# Patient Record
Sex: Female | Born: 1995 | Race: Black or African American | Hispanic: No | Marital: Single | Smoking: Never smoker
Health system: Southern US, Community
[De-identification: ages and names within clinical notes are randomized; demographics above are authoritative.]

## PROBLEM LIST (undated history)

## (undated) ENCOUNTER — Inpatient Hospital Stay (HOSPITAL_COMMUNITY): Payer: Self-pay

## (undated) DIAGNOSIS — B999 Unspecified infectious disease: Secondary | ICD-10-CM

## (undated) HISTORY — PX: NO PAST SURGERIES: SHX2092

---
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
## 2013-05-12 NOTE — L&D Delivery Note (Signed)
Delivery Note At 3:37 PM a viable female was delivered via Vaginal, Spontaneous Delivery (Presentation: Left Occiput Anterior).  APGAR: 8, ; weight .   Placenta status: Intact, Spontaneous.  Cord: 3 vessels with the following complications: .  Cord pH: not done  Anesthesia: Epidural  Episiotomy: Median Lacerations:  Suture Repair: 2.0 vicryl Est. Blood Loss (mL):   Mom to postpartum.  Baby to Couplet care / Skin to Skin.  MARSHALL,BERNARD A 01/25/2014, 3:52 PM

## 2013-06-07 ENCOUNTER — Emergency Department (INDEPENDENT_AMBULATORY_CARE_PROVIDER_SITE_OTHER)
Admission: EM | Admit: 2013-06-07 | Discharge: 2013-06-07 | Disposition: A | Discharge status: Home / Self Care | Payer: Medicaid Other | Source: Home / Self Care | Attending: Family Medicine | Admitting: Family Medicine

## 2013-06-07 ENCOUNTER — Encounter (HOSPITAL_COMMUNITY): Payer: Self-pay | Admitting: Emergency Medicine

## 2013-06-07 DIAGNOSIS — K529 Noninfective gastroenteritis and colitis, unspecified: Secondary | ICD-10-CM

## 2013-06-07 DIAGNOSIS — K5289 Other specified noninfective gastroenteritis and colitis: Secondary | ICD-10-CM

## 2013-06-07 MED ORDER — ONDANSETRON HCL 8 MG PO TABS: 8.0000 mg | ORAL_TABLET | Freq: Three times a day (TID) | ORAL | Status: DC | PRN

## 2013-06-07 MED ORDER — ONDANSETRON 4 MG PO TBDP
8.0000 mg | ORAL_TABLET | Freq: Once | ORAL | Status: AC
  Administered 2013-06-07: 8 mg via ORAL

## 2013-06-07 NOTE — Discharge Instructions (Signed)
Thank you for coming in today. Use Zofran every 8 hours as needed for vomiting starting 8 hours from now.  Continue frequent fluids as tolerated.  If your belly pain worsens, or you have high fever, bad vomiting, blood in your stool or black tarry stool go to the Emergency Room.

## 2013-06-07 NOTE — ED Provider Notes (Signed)
Mariah Leonard is a 18 y.o. female who presents to Urgent Care today for nausea vomiting and diarrhea occurring since last night. Symptoms started after eating a pizza restaurant. Her friend was also sick. She has had multiple episodes of vomiting and diarrhea. She denies any abdominal pain or blood in her vomit or stool. She feels well otherwise no fevers or chills.   History reviewed. No pertinent past medical history. History  Substance Use Topics  . Smoking status: Never Smoker   . Smokeless tobacco: Not on file  . Alcohol Use: No   ROS as above Medications: No current facility-administered medications for this encounter.   Current Outpatient Prescriptions  Medication Sig Dispense Refill  . ondansetron (ZOFRAN) 8 MG tablet Take 1 tablet (8 mg total) by mouth every 8 (eight) hours as needed for nausea or vomiting.  20 tablet  0    Exam:  BP 114/69  Pulse 77  Temp(Src) 98.3 F (36.8 C) (Oral)  Resp 18  SpO2 100%  LMP 04/30/2013 Gen: Well NAD HEENT: EOMI,  MMM Lungs: Normal work of breathing. CTABL Heart: RRR no MRG Abd: NABS, Soft. NT, ND Exts: Brisk capillary refill, warm and well perfused.    Assessment and Plan: 18 y.o. female with viral gastroenteritis. Plan to treat with Zofran oral fluids and watchful waiting  Discussed warning signs or symptoms. Please see discharge instructions. Patient expresses understanding.    Rodolph BongEvan S Jadd Gasior, MD 06/07/13 [phone]

## 2013-06-07 NOTE — ED Notes (Signed)
Pt c/o having vomiting and diarrhea episodes since eating out last night Reports 6 vomiting and diarrhea episodes today Denies fevers, colds sxs, urinary sxs Alert w/no signs of acute distress.

## 2013-06-09 ENCOUNTER — Emergency Department (HOSPITAL_COMMUNITY)
Admission: EM | Admit: 2013-06-09 | Discharge: 2013-06-09 | Disposition: A | Discharge status: Home / Self Care | Payer: Medicaid Other | Attending: Emergency Medicine | Admitting: Emergency Medicine

## 2013-06-09 ENCOUNTER — Encounter (HOSPITAL_COMMUNITY): Payer: Self-pay | Admitting: Emergency Medicine

## 2013-06-09 DIAGNOSIS — Z3490 Encounter for supervision of normal pregnancy, unspecified, unspecified trimester: Secondary | ICD-10-CM

## 2013-06-09 DIAGNOSIS — O210 Mild hyperemesis gravidarum: Secondary | ICD-10-CM | POA: Insufficient documentation

## 2013-06-09 DIAGNOSIS — R109 Unspecified abdominal pain: Secondary | ICD-10-CM | POA: Insufficient documentation

## 2013-06-09 DIAGNOSIS — O9989 Other specified diseases and conditions complicating pregnancy, childbirth and the puerperium: Secondary | ICD-10-CM | POA: Insufficient documentation

## 2013-06-09 LAB — URINALYSIS, ROUTINE W REFLEX MICROSCOPIC
Glucose, UA: NEGATIVE mg/dL
Ketones, ur: 15 mg/dL — AB
Nitrite: NEGATIVE
PROTEIN: 30 mg/dL — AB
SPECIFIC GRAVITY, URINE: 1.03 (ref 1.005–1.030)
UROBILINOGEN UA: 1 mg/dL (ref 0.0–1.0)
pH: 6.5 (ref 5.0–8.0)

## 2013-06-09 LAB — COMPREHENSIVE METABOLIC PANEL
ALT: 12 U/L (ref 0–35)
AST: 17 U/L (ref 0–37)
Albumin: 4 g/dL (ref 3.5–5.2)
Alkaline Phosphatase: 53 U/L (ref 39–117)
BILIRUBIN TOTAL: 0.3 mg/dL (ref 0.3–1.2)
BUN: 9 mg/dL (ref 6–23)
CALCIUM: 9.1 mg/dL (ref 8.4–10.5)
CHLORIDE: 103 meq/L (ref 96–112)
CO2: 23 mEq/L (ref 19–32)
Creatinine, Ser: 0.72 mg/dL (ref 0.50–1.10)
GFR calc Af Amer: >90 mL/min (ref >90)
Glucose, Bld: 99 mg/dL (ref 70–99)
Potassium: 3.9 mEq/L (ref 3.7–5.3)
Sodium: 138 mEq/L (ref 137–147)
Total Protein: 7.7 g/dL (ref 6.0–8.3)

## 2013-06-09 LAB — CBC WITH DIFFERENTIAL/PLATELET
BASOS PCT: 0 % (ref 0–1)
Basophils Absolute: 0 10*3/uL (ref 0.0–0.1)
EOS ABS: 0.1 10*3/uL (ref 0.0–0.7)
EOS PCT: 1 % (ref 0–5)
HEMATOCRIT: 35.7 % — AB (ref 36.0–46.0)
Hemoglobin: 12.2 g/dL (ref 12.0–15.0)
Lymphocytes Relative: 25 % (ref 12–46)
Lymphs Abs: 1.7 10*3/uL (ref 0.7–4.0)
MCH: 27.9 pg (ref 26.0–34.0)
MCHC: 34.2 g/dL (ref 30.0–36.0)
MCV: 81.7 fL (ref 78.0–100.0)
MONO ABS: 0.7 10*3/uL (ref 0.1–1.0)
MONOS PCT: 10 % (ref 3–12)
NEUTROS ABS: 4.6 10*3/uL (ref 1.7–7.7)
Neutrophils Relative %: 65 % (ref 43–77)
Platelets: 233 10*3/uL (ref 150–400)
RBC: 4.37 MIL/uL (ref 3.87–5.11)
RDW: 12.6 % (ref 11.5–15.5)
WBC: 7.1 10*3/uL (ref 4.0–10.5)

## 2013-06-09 LAB — URINE MICROSCOPIC-ADD ON

## 2013-06-09 LAB — POCT PREGNANCY, URINE: PREG TEST UR: POSITIVE — AB

## 2013-06-09 MED ORDER — SODIUM CHLORIDE 0.9 % IV BOLUS (SEPSIS)
1000.0000 mL | Freq: Once | INTRAVENOUS | Status: AC
  Administered 2013-06-09: 1000 mL via INTRAVENOUS

## 2013-06-09 MED ORDER — ONDANSETRON HCL 4 MG/2ML IJ SOLN: 4.0000 mg | Freq: Once | INTRAMUSCULAR | Status: DC

## 2013-06-09 MED ORDER — PANTOPRAZOLE SODIUM 40 MG IV SOLR
40.0000 mg | Freq: Once | INTRAVENOUS | Status: AC
  Administered 2013-06-09: 40 mg via INTRAVENOUS
  Filled 2013-06-09: qty 40

## 2013-06-09 MED ORDER — METRONIDAZOLE 500 MG PO TABS
2000.0000 mg | ORAL_TABLET | Freq: Once | ORAL | Status: AC
  Administered 2013-06-09: 2000 mg via ORAL
  Filled 2013-06-09: qty 4

## 2013-06-09 MED ORDER — ONDANSETRON HCL 4 MG/2ML IJ SOLN
4.0000 mg | Freq: Once | INTRAMUSCULAR | Status: AC
  Administered 2013-06-09: 4 mg via INTRAVENOUS
  Filled 2013-06-09: qty 2

## 2013-06-09 MED ORDER — PROMETHAZINE HCL 25 MG PO TABS: 25.0000 mg | ORAL_TABLET | Freq: Four times a day (QID) | ORAL | Status: DC | PRN

## 2013-06-09 NOTE — Discharge Instructions (Signed)
Follow up for your pregnancy

## 2013-06-09 NOTE — ED Notes (Addendum)
Pt reports having abd and n/v/d for several days. Went to ucc on 1/27 and told it was food poisoning, was given nausea meds but no relief of symptoms.

## 2013-06-09 NOTE — ED Provider Notes (Signed)
CSN: [id number]     Arrival date & time 06/09/13  1152 History   First MD Initiated Contact with Patient 06/09/13 1251     Chief Complaint  Patient presents with  . Emesis  . Abdominal Pain   (Consider location/radiation/quality/duration/timing/severity/associated sxs/prior Treatment) Patient is a 18 y.o. female presenting with vomiting and abdominal pain. The history is provided by the patient (the pt complains of vomiting).  Emesis Severity:  Moderate Timing:  Intermittent Quality:  Unable to specify Able to tolerate:  Liquids Progression:  Unchanged Chronicity:  Recurrent Recent urination:  Normal Associated symptoms: abdominal pain   Associated symptoms: no diarrhea and no headaches   Abdominal Pain Associated symptoms: vomiting   Associated symptoms: no chest pain, no cough, no diarrhea, no fatigue and no hematuria     History reviewed. No pertinent past medical history. History reviewed. No pertinent past surgical history. History reviewed. No pertinent family history. History  Substance Use Topics  . Smoking status: Never Smoker   . Smokeless tobacco: Not on file  . Alcohol Use: No   OB History   Grav Para Term Preterm Abortions TAB SAB Ect Mult Living                 Review of Systems  Constitutional: Negative for appetite change and fatigue.  HENT: Negative for congestion, ear discharge and sinus pressure.   Eyes: Negative for discharge.  Respiratory: Negative for cough.   Cardiovascular: Negative for chest pain.  Gastrointestinal: Positive for vomiting and abdominal pain. Negative for diarrhea.  Genitourinary: Negative for frequency and hematuria.  Musculoskeletal: Negative for back pain.  Skin: Negative for rash.  Neurological: Negative for seizures and headaches.  Psychiatric/Behavioral: Negative for hallucinations.    Allergies  Review of patient's allergies indicates no known allergies.  Home Medications   Current Outpatient Rx  Name  Route   Sig  Dispense  Refill  . ondansetron (ZOFRAN) 8 MG tablet   Oral   Take 1 tablet (8 mg total) by mouth every 8 (eight) hours as needed for nausea or vomiting.   20 tablet   0   . promethazine (PHENERGAN) 25 MG tablet   Oral   Take 1 tablet (25 mg total) by mouth every 6 (six) hours as needed for nausea or vomiting.   10 tablet   0    BP 103/54  Pulse 51  Temp(Src) 97.6 F (36.4 C) (Oral)  Resp 18  Ht 5\' 5"  (1.651 m)  Wt 120 lb (54.432 kg)  BMI 19.97 kg/m2  SpO2 100%  LMP 04/30/2013 Physical Exam  Constitutional: She is oriented to person, place, and time. She appears well-developed.  HENT:  Head: Normocephalic.  Eyes: Conjunctivae and EOM are normal. No scleral icterus.  Neck: Neck supple. No thyromegaly present.  Cardiovascular: Normal rate and regular rhythm.  Exam reveals no gallop and no friction rub.   No murmur heard. Pulmonary/Chest: No stridor. She has no wheezes. She has no rales. She exhibits no tenderness.  Abdominal: She exhibits no distension. There is no tenderness. There is no rebound.  Musculoskeletal: Normal range of motion. She exhibits no edema.  Lymphadenopathy:    She has no cervical adenopathy.  Neurological: She is oriented to person, place, and time. She exhibits normal muscle tone. Coordination normal.  Skin: No rash noted. No erythema.  Psychiatric: She has a normal mood and affect. Her behavior is normal.    ED Course  Procedures (including critical care time) Labs Review Labs  Reviewed  CBC WITH DIFFERENTIAL - Abnormal; Notable for the following:    HCT 35.7 (*)    All other components within normal limits  URINALYSIS, ROUTINE W REFLEX MICROSCOPIC - Abnormal; Notable for the following:    Color, Urine AMBER (*)    APPearance CLOUDY (*)    Hgb urine dipstick MODERATE (*)    Bilirubin Urine SMALL (*)    Ketones, ur 15 (*)    Protein, ur 30 (*)    Leukocytes, UA LARGE (*)    All other components within normal limits  URINE  MICROSCOPIC-ADD ON - Abnormal; Notable for the following:    Squamous Epithelial / LPF MANY (*)    Bacteria, UA FEW (*)    All other components within normal limits  POCT PREGNANCY, URINE - Abnormal; Notable for the following:    Preg Test, Ur POSITIVE (*)    All other components within normal limits  URINE CULTURE  COMPREHENSIVE METABOLIC PANEL   Imaging Review No results found.  EKG Interpretation   None      Pt improved with tx MDM   1. Pregnancy        Benny LennertJoseph L Eloyse Causey, MD 06/09/13 [id number]

## 2013-06-10 LAB — URINE CULTURE: Colony Count: 25000

## 2013-06-13 ENCOUNTER — Encounter (HOSPITAL_COMMUNITY): Payer: Self-pay

## 2013-06-13 ENCOUNTER — Inpatient Hospital Stay (HOSPITAL_COMMUNITY)
Admission: AD | Admit: 2013-06-13 | Discharge: 2013-06-13 | Disposition: A | Discharge status: Home / Self Care | Payer: Medicaid Other | Source: Ambulatory Visit | Attending: Obstetrics & Gynecology | Admitting: Obstetrics & Gynecology

## 2013-06-13 ENCOUNTER — Inpatient Hospital Stay (HOSPITAL_COMMUNITY): Payer: Medicaid Other

## 2013-06-13 DIAGNOSIS — O26899 Other specified pregnancy related conditions, unspecified trimester: Secondary | ICD-10-CM

## 2013-06-13 DIAGNOSIS — R109 Unspecified abdominal pain: Secondary | ICD-10-CM | POA: Insufficient documentation

## 2013-06-13 DIAGNOSIS — O210 Mild hyperemesis gravidarum: Secondary | ICD-10-CM | POA: Insufficient documentation

## 2013-06-13 DIAGNOSIS — O99891 Other specified diseases and conditions complicating pregnancy: Secondary | ICD-10-CM | POA: Insufficient documentation

## 2013-06-13 DIAGNOSIS — R112 Nausea with vomiting, unspecified: Secondary | ICD-10-CM

## 2013-06-13 DIAGNOSIS — O9989 Other specified diseases and conditions complicating pregnancy, childbirth and the puerperium: Principal | ICD-10-CM

## 2013-06-13 LAB — URINALYSIS, ROUTINE W REFLEX MICROSCOPIC
Glucose, UA: NEGATIVE mg/dL
KETONES UR: 40 mg/dL — AB
Leukocytes, UA: NEGATIVE
NITRITE: NEGATIVE
PROTEIN: 30 mg/dL — AB
Specific Gravity, Urine: >1.03 — ABNORMAL HIGH (ref 1.005–1.030)
UROBILINOGEN UA: 1 mg/dL (ref 0.0–1.0)
pH: 6 (ref 5.0–8.0)

## 2013-06-13 LAB — CBC
HCT: 35.5 % — ABNORMAL LOW (ref 36.0–46.0)
HEMOGLOBIN: 12.2 g/dL (ref 12.0–15.0)
MCH: 27.5 pg (ref 26.0–34.0)
MCHC: 34.4 g/dL (ref 30.0–36.0)
MCV: 80 fL (ref 78.0–100.0)
PLATELETS: 258 10*3/uL (ref 150–400)
RBC: 4.44 MIL/uL (ref 3.87–5.11)
RDW: 12.5 % (ref 11.5–15.5)
WBC: 7.5 10*3/uL (ref 4.0–10.5)

## 2013-06-13 LAB — URINE MICROSCOPIC-ADD ON

## 2013-06-13 LAB — WET PREP, GENITAL
CLUE CELLS WET PREP: NONE SEEN
Trich, Wet Prep: NONE SEEN
Yeast Wet Prep HPF POC: NONE SEEN

## 2013-06-13 LAB — HCG, QUANTITATIVE, PREGNANCY: hCG, Beta Chain, Quant, S: 78356 m[IU]/mL — ABNORMAL HIGH (ref <5)

## 2013-06-13 MED ORDER — PROMETHAZINE HCL 25 MG/ML IJ SOLN
25.0000 mg | Freq: Once | INTRAMUSCULAR | Status: AC
  Administered 2013-06-13: 25 mg via INTRAMUSCULAR
  Filled 2013-06-13: qty 1

## 2013-06-13 NOTE — MAU Note (Signed)
Was given flagyl for trich however vomited meds back up immediately. Was sent home without additional rx.

## 2013-06-13 NOTE — MAU Provider Note (Signed)
Attestation of Attending Supervision of Advanced Practitioner (CNM/NP): Evaluation and management procedures were performed by the Advanced Practitioner under my supervision and collaboration.  I have reviewed the Advanced Practitioner's note and chart, and I agree with the management and plan.  HARRAWAY-SMITH, Tamika Nou 9:45 PM

## 2013-06-13 NOTE — MAU Note (Signed)
Pt states here for lower abd pain bilaterally. Has had hyperemesis. Denies bleeding or abnormal vag d/c.

## 2013-06-13 NOTE — Discharge Instructions (Signed)
Abdominal Pain During Pregnancy Abdominal pain is common in pregnancy. Most of the time, it does not cause harm. There are many causes of abdominal pain. Some causes are more serious than others. Some of the causes of abdominal pain in pregnancy are easily diagnosed. Occasionally, the diagnosis takes time to understand. Other times, the cause is not determined. Abdominal pain can be a sign that something is very wrong with the pregnancy, or the pain may have nothing to do with the pregnancy at all. For this reason, always tell your health care provider if you have any abdominal discomfort. HOME CARE INSTRUCTIONS  Monitor your abdominal pain for any changes. The following actions may help to alleviate any discomfort you are experiencing:  Do not have sexual intercourse or put anything in your vagina until your symptoms go away completely.  Get plenty of rest until your pain improves.  Drink clear fluids if you feel nauseous. Avoid solid food as long as you are uncomfortable or nauseous.  Only take over-the-counter or prescription medicine as directed by your health care provider.  Keep all follow-up appointments with your health care provider. SEEK IMMEDIATE MEDICAL CARE IF:  You are bleeding, leaking fluid, or passing tissue from the vagina.  You have increasing pain or cramping.  You have persistent vomiting.  You have painful or bloody urination.  You have a fever.  You notice a decrease in your baby's movements.  You have extreme weakness or feel faint.  You have shortness of breath, with or without abdominal pain.  You develop a severe headache with abdominal pain.  You have abnormal vaginal discharge with abdominal pain.  You have persistent diarrhea.  You have abdominal pain that continues even after rest, or gets worse. MAKE SURE YOU:   Understand these instructions.  Will watch your condition.  Will get help right away if you are not doing well or get  worse. Document Released: 04/28/2005 Document Revised: 02/16/2013 Document Reviewed: 11/25/2012 [facility] Patient Information 2014 [place], [place]. Diet for Diarrhea, Adult Frequent, runny stools (diarrhea) may be caused or worsened by food or drink. Diarrhea may be relieved by changing your diet. Since diarrhea can last up to 7 days, it is easy for you to lose too much fluid from the body and become dehydrated. Fluids that are lost need to be replaced. Along with a modified diet, make sure you drink enough fluids to keep your urine clear or pale yellow. DIET INSTRUCTIONS  Ensure adequate fluid intake (hydration): have 1 cup (8 oz) of fluid for each diarrhea episode. Avoid fluids that contain simple sugars or sports drinks, fruit juices, whole milk products, and sodas. Your urine should be clear or pale yellow if you are drinking enough fluids. Hydrate with an oral rehydration solution that you can purchase at pharmacies, retail stores, and online. You can prepare an oral rehydration solution at home by mixing the following ingredients together:    tsp table salt.   tsp baking soda.   tsp salt substitute containing potassium chloride.  1  tablespoons sugar.  1 L (34 oz) of water.  Certain foods and beverages may increase the speed at which food moves through the gastrointestinal (GI) tract. These foods and beverages should be avoided and include:  Caffeinated and alcoholic beverages.  High-fiber foods, such as raw fruits and vegetables, nuts, seeds, and whole grain breads and cereals.  Foods and beverages sweetened with sugar alcohols, such as xylitol, sorbitol, and mannitol.  Some foods may be well tolerated  and may help thicken stool including:  Starchy foods, such as rice, toast, pasta, low-sugar cereal, oatmeal, grits, baked potatoes, crackers, and bagels.   Bananas.   Applesauce.  Add probiotic-rich foods to help increase healthy bacteria in the GI tract, such as yogurt and  fermented milk products. RECOMMENDED FOODS AND BEVERAGES Starches Choose foods with less than 2 g of fiber per serving.  Recommended:  White, [place], and pita breads, plain rolls, buns, bagels. Plain muffins, matzo. Soda, saltine, or graham crackers. Pretzels, melba toast, zwieback. Cooked cereals made with water: cornmeal, farina, cream cereals. Dry cereals: refined corn, wheat, rice. Potatoes prepared any way without skins, refined macaroni, spaghetti, noodles, refined rice.  Avoid:  Bread, rolls, or crackers made with whole wheat, multi-grains, rye, bran seeds, nuts, or coconut. Corn tortillas or taco shells. Cereals containing whole grains, multi-grains, bran, coconut, nuts, raisins. Cooked or dry oatmeal. Coarse wheat cereals, granola. Cereals advertised as "high-fiber." Potato skins. Whole grain pasta, wild or brown rice. Popcorn. Sweet potatoes, yams. Sweet rolls, doughnuts, waffles, pancakes, sweet breads. Vegetables  Recommended: Strained tomato and vegetable juices. Most well-cooked and canned vegetables without seeds. Fresh: Tender lettuce, cucumber without the skin, cabbage, spinach, bean sprouts.  Avoid: Fresh, cooked, or canned: Artichokes, baked beans, beet greens, broccoli, Brussels sprouts, corn, kale, legumes, peas, sweet potatoes. Cooked: Green or red cabbage, spinach. Avoid large servings of any vegetables because vegetables shrink when cooked, and they contain more fiber per serving than fresh vegetables. Fruit  Recommended: Cooked or canned: Apricots, applesauce, cantaloupe, cherries, fruit cocktail, grapefruit, grapes, kiwi, mandarin oranges, peaches, pears, plums, watermelon. Fresh: Apples without skin, ripe banana, grapes, cantaloupe, cherries, grapefruit, peaches, oranges, plums. Keep servings limited to  cup or 1 piece.  Avoid: Fresh: Apples with skin, apricots, mangoes, pears, raspberries, strawberries. Prune juice, stewed or dried prunes. Dried fruits, raisins, dates.  Large servings of all fresh fruits. Protein  Recommended: Ground or well-cooked tender beef, ham, veal, lamb, pork, or poultry. Eggs. Fish, oysters, shrimp, lobster, other seafoods. Liver, organ meats.  Avoid: Tough, fibrous meats with gristle. Peanut butter, smooth or chunky. Cheese, nuts, seeds, legumes, dried peas, beans, lentils. Dairy  Recommended: Yogurt, lactose-free milk, kefir, drinkable yogurt, buttermilk, soy milk, or plain hard cheese.  Avoid: Milk, chocolate milk, beverages made with milk, such as milkshakes. Soups  Recommended: Bouillon, broth, or soups made from allowed foods. Any strained soup.  Avoid: Soups made from vegetables that are not allowed, cream or milk-based soups. Desserts and Sweets  Recommended: Sugar-free gelatin, sugar-free frozen ice pops made without sugar alcohol.  Avoid: Plain cakes and cookies, pie made with fruit, pudding, custard, cream pie. Gelatin, fruit, ice, sherbet, frozen ice pops. Ice cream, ice milk without nuts. Plain hard candy, honey, jelly, molasses, syrup, sugar, chocolate syrup, gumdrops, marshmallows. Fats and Oils  Recommended: Limit fats to less than 8 tsp per day.  Avoid: Seeds, nuts, olives, avocados. Margarine, butter, cream, mayonnaise, salad oils, plain salad dressings. Plain gravy, crisp bacon without rind. Beverages  Recommended: Water, decaffeinated teas, oral rehydration solutions, sugar-free beverages not sweetened with sugar alcohols.  Avoid: Fruit juices, caffeinated beverages (coffee, tea, soda), alcohol, sports drinks, or lemon-lime soda. Condiments  Recommended: Ketchup, mustard, horseradish, vinegar, cocoa powder. Spices in moderation: allspice, basil, bay leaves, celery powder or leaves, cinnamon, cumin powder, curry powder, ginger, mace, marjoram, onion or garlic powder, oregano, paprika, parsley flakes, ground pepper, rosemary, sage, savory, tarragon, thyme, turmeric.  Avoid: Coconut, honey. Document  Released: 07/19/2003 Document Revised: 01/21/2012  Document Reviewed: 09/12/2011 [facility] Patient Information 2014 [place].

## 2013-06-13 NOTE — MAU Provider Note (Signed)
History     CSN: [id number]  Arrival date and time: 06/13/13 1725   None     Chief Complaint  Patient presents with  . Abdominal Pain   HPI Comments: Mariah Leonard 18 y.o. G1P0 presents to MAU with abdominal pains in early pregnancy and nausea and vomiting. She has been in pain for 2 days and N/V for several. She has been given phenergan and has not been able to hold it down. She does have Zofran but was told since she is pregnant not to take it.    Abdominal Pain Associated symptoms include nausea and vomiting.      No past medical history on file.  No past surgical history on file.  No family history on file.  History  Substance Use Topics  . Smoking status: Never Smoker   . Smokeless tobacco: Not on file  . Alcohol Use: No    Allergies: No Known Allergies  Prescriptions prior to admission  Medication Sig Dispense Refill  . ondansetron (ZOFRAN) 8 MG tablet Take 1 tablet (8 mg total) by mouth every 8 (eight) hours as needed for nausea or vomiting.  20 tablet  0  . promethazine (PHENERGAN) 25 MG tablet Take 1 tablet (25 mg total) by mouth every 6 (six) hours as needed for nausea or vomiting.  10 tablet  0    Review of Systems  Constitutional: Negative.   HENT: Negative.   Eyes: Negative.   Respiratory: Negative.   Cardiovascular: Negative.   Gastrointestinal: Positive for nausea, vomiting and abdominal pain.  Genitourinary: Negative.   Skin: Negative.   Neurological: Negative.   Psychiatric/Behavioral: Negative.    Physical Exam   Blood pressure 122/54, pulse 60, temperature 98.3 F (36.8 C), temperature source Oral, resp. rate 16, height 5\' 5"  (1.651 m), weight 55.849 kg (123 lb 2 oz), last menstrual period 03/31/2013.  Physical Exam  Constitutional: She is oriented to person, place, and time. She appears well-developed and well-nourished. No distress.  HENT:  Head: Normocephalic and atraumatic.  Eyes: Pupils are equal, round, and reactive to  light.  GI: Soft. She exhibits no distension and no mass. There is no tenderness. There is no rebound and no guarding.  Genitourinary:  Genital:negative external Vaginal:thick white vag discharge Cervix:closed Bimanual:tender uterus   Neurological: She is alert and oriented to person, place, and time.  Skin: Skin is warm and dry.  Psychiatric: She has a normal mood and affect. Her behavior is normal. Judgment and thought content normal.   Results for orders placed during the hospital encounter of 06/13/13 (from the past 24 hour(s))  URINALYSIS, ROUTINE W REFLEX MICROSCOPIC     Status: Abnormal   Collection Time    06/13/13  6:27 PM      Result Value Range   Color, Urine YELLOW  YELLOW   APPearance HAZY (*) CLEAR   Specific Gravity, Urine >1.030 (*) 1.005 - 1.030   pH 6.0  5.0 - 8.0   Glucose, UA NEGATIVE  NEGATIVE mg/dL   Hgb urine dipstick SMALL (*) NEGATIVE   Bilirubin Urine SMALL (*) NEGATIVE   Ketones, ur 40 (*) NEGATIVE mg/dL   Protein, ur 30 (*) NEGATIVE mg/dL   Urobilinogen, UA 1.0  0.0 - 1.0 mg/dL   Nitrite NEGATIVE  NEGATIVE   Leukocytes, UA NEGATIVE  NEGATIVE  URINE MICROSCOPIC-ADD ON     Status: Abnormal   Collection Time    06/13/13  6:27 PM      Result Value Range  Squamous Epithelial / LPF FEW (*) RARE   WBC, UA 3-6  <3 WBC/hpf   RBC / HPF 7-10  <3 RBC/hpf   Bacteria, UA FEW (*) RARE   Urine-Other MUCOUS PRESENT    CBC     Status: Abnormal   Collection Time    06/13/13  7:56 PM      Result Value Range   WBC 7.5  4.0 - 10.5 K/uL   RBC 4.44  3.87 - 5.11 MIL/uL   Hemoglobin 12.2  12.0 - 15.0 g/dL   HCT 96.0 (*) 45.4 - 09.8 %   MCV 80.0  78.0 - 100.0 fL   MCH 27.5  26.0 - 34.0 pg   MCHC 34.4  30.0 - 36.0 g/dL   RDW 11.9  14.7 - 82.9 %   Platelets 258  150 - 400 K/uL  ABO/RH     Status: None   Collection Time    06/13/13  7:56 PM      Result Value Range   ABO/RH(D) A POS    HCG, QUANTITATIVE, PREGNANCY     Status: Abnormal   Collection Time     06/13/13  7:56 PM      Result Value Range   hCG, Beta Chain, Quant, [place] [address] (*) <5 mIU/mL   US Ob Comp Less 14 Wks  06/13/2013   CLINICAL DATA:  18 year old G1, LMP 03/31/2013 (10 weeks 4 days), presenting with pelvic pain. Quantitative beta HCG pending.  EXAM: OBSTETRIC <14 WK ULTRASOUND  TECHNIQUE: Transabdominal ultrasound was performed for evaluation of the gestation as well as the maternal uterus and adnexal regions.  COMPARISON:  None.  FINDINGS: Intrauterine gestational sac: Single, normal in appearance.  Yolk sac:  Present.  Embryo:  Present.  Cardiac Activity: Present.  Heart Rate: 133 bpm  CRL:   5.2 mm   6 w 2d                  [place] EDC: 02/04/2014.  Maternal uterus/adnexae: No subchorionic hemorrhage. Normal-appearing ovaries bilaterally containing small follicular cysts, the right measuring approximately 3.6 x 2.1 x 2.0 cm and the left measuring approximately 3.2 x 1.8 x 2.6 cm. No adnexal masses or free pelvic fluid.  IMPRESSION: 1. Single live intrauterine embryo with estimated gestational a 6 weeks 2 days by crown-rump length. This gestational age is much less than the estimated gestational age by LMP of 10 weeks 4 days, consistent with incorrect LMP date. Ultrasound [facility] 02/04/2014. 2. No subchorionic hemorrhage. 3. Normal ovaries.  No adnexal masses or free pelvic fluid.   Electronically Signed   By: Hulan Saas M.D.   On: 06/13/2013 19:51    MAU Course  Procedures  MDM CBC, U/S, ABORh, U/S, wet prep, GC/Chlamydia She will be called with results of wet prep , GC, Chlamydia if any abnormal  Phenergan 25 mg IM now  Assessment and Plan   A:  Pain in early pregnancy Nausea and vomiting  P; Take Zofran and Phenergan as directed 6 small bland meals Will call with any abnormal labs Start PNV Start Prenatal care    [name] 06/13/2013, 8:22 PM

## 2013-06-14 LAB — URINE CULTURE
COLONY COUNT: NO GROWTH
Culture: NO GROWTH

## 2013-06-14 LAB — ABO/RH: ABO/RH(D): A POS

## 2013-06-15 LAB — GC/CHLAMYDIA PROBE AMP
CT Probe RNA: NEGATIVE
GC Probe RNA: NEGATIVE

## 2013-07-25 ENCOUNTER — Inpatient Hospital Stay (HOSPITAL_COMMUNITY)
Admission: AD | Admit: 2013-07-25 | Discharge: 2013-07-25 | Disposition: A | Discharge status: Home / Self Care | Payer: Medicaid Other | Source: Ambulatory Visit | Attending: Obstetrics and Gynecology | Admitting: Obstetrics and Gynecology

## 2013-07-25 ENCOUNTER — Encounter (HOSPITAL_COMMUNITY): Payer: Self-pay

## 2013-07-25 DIAGNOSIS — R109 Unspecified abdominal pain: Secondary | ICD-10-CM | POA: Insufficient documentation

## 2013-07-25 DIAGNOSIS — O210 Mild hyperemesis gravidarum: Secondary | ICD-10-CM | POA: Insufficient documentation

## 2013-07-25 DIAGNOSIS — Z3490 Encounter for supervision of normal pregnancy, unspecified, unspecified trimester: Secondary | ICD-10-CM

## 2013-07-25 DIAGNOSIS — R112 Nausea with vomiting, unspecified: Secondary | ICD-10-CM

## 2013-07-25 HISTORY — DX: Unspecified infectious disease: B99.9

## 2013-07-25 LAB — URINALYSIS, ROUTINE W REFLEX MICROSCOPIC
Bilirubin Urine: NEGATIVE
Glucose, UA: NEGATIVE mg/dL
Ketones, ur: NEGATIVE mg/dL
LEUKOCYTES UA: NEGATIVE
Nitrite: NEGATIVE
PH: 8 (ref 5.0–8.0)
PROTEIN: NEGATIVE mg/dL
Specific Gravity, Urine: 1.015 (ref 1.005–1.030)
Urobilinogen, UA: 1 mg/dL (ref 0.0–1.0)

## 2013-07-25 LAB — URINE MICROSCOPIC-ADD ON

## 2013-07-25 MED ORDER — PROMETHAZINE HCL 25 MG PO TABS
25.0000 mg | ORAL_TABLET | Freq: Once | ORAL | Status: AC
  Administered 2013-07-25: 25 mg via ORAL
  Filled 2013-07-25: qty 1

## 2013-07-25 MED ORDER — PROMETHAZINE HCL 25 MG PO TABS: 25.0000 mg | ORAL_TABLET | Freq: Once | ORAL | Status: DC

## 2013-07-25 NOTE — MAU Provider Note (Signed)
History     CSN: [id number]  Arrival date and time: 07/25/13 1348   First Provider Initiated Contact with Patient 07/25/13 1501      Chief Complaint  Patient presents with  . Emesis During Pregnancy  . Abdominal Cramping   HPI Comments: Mariah Leonard 18 y.o. G1P0 presents to MAU with nausea and vomiting. She was seen here one month ago, given phenergan and zofran and did well till she ran out yesterday. She will not see Dr Gaynell Face till Wednesday. Otherwise she is doing well with pregnancy. +FHT  Abdominal Cramping Associated symptoms include nausea and vomiting.      Past Medical History  Diagnosis Date  . Medical history non-contributory   . Infection     UTI    Past Surgical History  Procedure Laterality Date  . No past surgeries      History reviewed. No pertinent family history.  History  Substance Use Topics  . Smoking status: Never Smoker   . Smokeless tobacco: Never Used  . Alcohol Use: No    Allergies: No Known Allergies  Prescriptions prior to admission  Medication Sig Dispense Refill  . promethazine (PHENERGAN) 25 MG tablet Take 1 tablet (25 mg total) by mouth every 6 (six) hours as needed for nausea or vomiting.  10 tablet  0    Review of Systems  Constitutional: Negative.   Respiratory: Negative.   Cardiovascular: Negative.   Gastrointestinal: Positive for nausea and vomiting.  Genitourinary: Negative.   Musculoskeletal: Negative.   Skin: Negative.   Neurological: Negative.   Psychiatric/Behavioral: Negative.    Physical Exam   Blood pressure 105/68, pulse 74, temperature 99 F (37.2 C), temperature source Oral, resp. rate 16, height 5\' 4"  (1.626 m), weight 55.611 kg (122 lb 9.6 oz), SpO2 100.00%.  Physical Exam  Constitutional: She is oriented to person, place, and time. She appears well-developed and well-nourished. No distress.  HENT:  Head: Normocephalic and atraumatic.  Eyes: Pupils are equal, round, and reactive to light.   Neck: Normal range of motion.  Cardiovascular: Normal rate, regular rhythm and normal heart sounds.   Respiratory: Effort normal and breath sounds normal.  GI: Soft. Bowel sounds are normal.  Genitourinary:  Not examined  Musculoskeletal: Normal range of motion.  Neurological: She is alert and oriented to person, place, and time.  Skin: Skin is warm and dry.  Psychiatric: She has a normal mood and affect. Her behavior is normal. Judgment and thought content normal.   Results for orders placed during the hospital encounter of 07/25/13 (from the past 24 hour(s))  URINALYSIS, ROUTINE W REFLEX MICROSCOPIC     Status: Abnormal   Collection Time    07/25/13  2:05 PM      Result Value Ref Range   Color, Urine YELLOW  YELLOW   APPearance CLOUDY (*) CLEAR   Specific Gravity, Urine 1.015  1.005 - 1.030   pH 8.0  5.0 - 8.0   Glucose, UA NEGATIVE  NEGATIVE mg/dL   Hgb urine dipstick SMALL (*) NEGATIVE   Bilirubin Urine NEGATIVE  NEGATIVE   Ketones, ur NEGATIVE  NEGATIVE mg/dL   Protein, ur NEGATIVE  NEGATIVE mg/dL   Urobilinogen, UA 1.0  0.0 - 1.0 mg/dL   Nitrite NEGATIVE  NEGATIVE   Leukocytes, UA NEGATIVE  NEGATIVE  URINE MICROSCOPIC-ADD ON     Status: Abnormal   Collection Time    07/25/13  2:05 PM      Result Value Ref Range  Squamous Epithelial / LPF MANY (*) RARE   WBC, UA 0-2  <3 WBC/hpf   Bacteria, UA FEW (*) RARE    MAU Course  Procedures  MDM  Phenergan 25 mg po now  Assessment and Plan   A: Nausea and vomiting in pregnancy  P: Phenergan 25 mg po q 6 hours prn Continue with PNV See Dr Gaynell FaceMarshall on Wednesday  Carolynn ServeBarefoot, Linkin Vizzini Miller 07/25/2013, 3:10 PM

## 2013-07-25 NOTE — MAU Note (Signed)
Patient states she has had nausea and vomiting since the beginning of March. Has not been able to keep anything down since that time. Has abdominal cramping, no bleeding or discharge.

## 2013-07-25 NOTE — Discharge Instructions (Signed)

## 2013-07-25 NOTE — MAU Note (Signed)
States the meds/ fluids she got when here before really  Helped, only recently she started throwing up again.  Ran out of meds end of February.

## 2013-07-26 LAB — CULTURE, OB URINE
Colony Count: NO GROWTH
Culture: NO GROWTH
SPECIAL REQUESTS: NORMAL

## 2013-08-15 LAB — OB RESULTS CONSOLE ABO/RH: RH Type: POSITIVE

## 2013-08-15 LAB — OB RESULTS CONSOLE ANTIBODY SCREEN: ANTIBODY SCREEN: NEGATIVE

## 2013-08-15 LAB — CYTOLOGY - PAP: PAP SMEAR: NEGATIVE

## 2013-08-15 LAB — OB RESULTS CONSOLE HEPATITIS B SURFACE ANTIGEN: Hepatitis B Surface Ag: NEGATIVE

## 2013-08-15 LAB — OB RESULTS CONSOLE RUBELLA ANTIBODY, IGM: RUBELLA: IMMUNE

## 2013-08-15 LAB — OB RESULTS CONSOLE HIV ANTIBODY (ROUTINE TESTING): HIV: NONREACTIVE

## 2013-08-15 LAB — OB RESULTS CONSOLE RPR: RPR: NONREACTIVE

## 2013-08-15 LAB — OB RESULTS CONSOLE GC/CHLAMYDIA
Chlamydia: NEGATIVE
GC PROBE AMP, GENITAL: NEGATIVE

## 2013-11-05 ENCOUNTER — Inpatient Hospital Stay (HOSPITAL_COMMUNITY)
Admission: AD | Admit: 2013-11-05 | Discharge: 2013-11-05 | Disposition: A | Discharge status: Home / Self Care | Payer: Medicaid Other | Source: Ambulatory Visit | Attending: Obstetrics | Admitting: Obstetrics

## 2013-11-05 ENCOUNTER — Encounter (HOSPITAL_COMMUNITY): Payer: Self-pay | Admitting: *Deleted

## 2013-11-05 DIAGNOSIS — R109 Unspecified abdominal pain: Secondary | ICD-10-CM | POA: Insufficient documentation

## 2013-11-05 DIAGNOSIS — O9989 Other specified diseases and conditions complicating pregnancy, childbirth and the puerperium: Principal | ICD-10-CM

## 2013-11-05 DIAGNOSIS — R262 Difficulty in walking, not elsewhere classified: Secondary | ICD-10-CM | POA: Insufficient documentation

## 2013-11-05 DIAGNOSIS — N949 Unspecified condition associated with female genital organs and menstrual cycle: Secondary | ICD-10-CM | POA: Insufficient documentation

## 2013-11-05 DIAGNOSIS — M7918 Myalgia, other site: Secondary | ICD-10-CM

## 2013-11-05 DIAGNOSIS — O99891 Other specified diseases and conditions complicating pregnancy: Secondary | ICD-10-CM | POA: Insufficient documentation

## 2013-11-05 DIAGNOSIS — IMO0001 Reserved for inherently not codable concepts without codable children: Secondary | ICD-10-CM

## 2013-11-05 LAB — URINALYSIS, ROUTINE W REFLEX MICROSCOPIC
Bilirubin Urine: NEGATIVE
Glucose, UA: NEGATIVE mg/dL
Hgb urine dipstick: NEGATIVE
Ketones, ur: NEGATIVE mg/dL
Leukocytes, UA: NEGATIVE
Nitrite: NEGATIVE
PROTEIN: NEGATIVE mg/dL
Specific Gravity, Urine: 1.015 (ref 1.005–1.030)
UROBILINOGEN UA: 1 mg/dL (ref 0.0–1.0)
pH: 8 (ref 5.0–8.0)

## 2013-11-05 NOTE — MAU Note (Signed)
Pt presents with complaints of pelvic pain since last night. Denies any vaginal bleeding or LOF.

## 2013-11-05 NOTE — Discharge Instructions (Signed)
Perform exercises at home as instructed. Take Tylenol 325 mg 2 tablets by mouth every 4 hours if needed for pain. Drink at least 8 8-oz glasses of water every day. Keep your appointment in the office on Monday.

## 2013-11-05 NOTE — MAU Provider Note (Signed)
History     CSN: [id number]  Arrival date and time: 11/05/13 1352   First Mariah Leonard Initiated Contact with Patient 11/05/13 1457      Chief Complaint  Patient presents with  . Pelvic Pain   HPI [name] 18 y.o. [id number]   G1 P0  Has had pain in low abdomen since last night.  Has difficulty walking.  Next appointment in the office is on Monday.  No vaginal bleeding.  No vaginal discharge or leaking.   Past Medical History  Diagnosis Date  . Medical history non-contributory   . Infection     UTI    Past Surgical History  Procedure Laterality Date  . No past surgeries      History reviewed. No pertinent family history.  History  Substance Use Topics  . Smoking status: Never Smoker   . Smokeless tobacco: Never Used  . Alcohol Use: No    Allergies: No Known Allergies  Prescriptions prior to admission  Medication Sig Dispense Refill  . Prenatal Vit-Fe Fumarate-FA (PRENATAL MULTIVITAMIN) TABS tablet Take 1 tablet by mouth every morning.        Review of Systems  Constitutional: Negative for fever.  Gastrointestinal: Positive for abdominal pain. Negative for nausea, vomiting, diarrhea and constipation.  Genitourinary:       No vaginal discharge. No vaginal bleeding. No dysuria.   Physical Exam   Blood pressure 110/58, pulse 68, temperature 97.5 F (36.4 C), temperature source Oral, resp. rate 18.  Physical Exam  Nursing note and vitals reviewed. Constitutional: She is oriented to person, place, and time. She appears well-developed and well-nourished.  HENT:  Head: Normocephalic.  Eyes: EOM are normal.  Neck: Neck supple.  GI: Soft. There is no tenderness.  No contractions seen on the monitor.  FHY baseline 130 with at least 10x10 accels noted.  Occasional contraction.  Genitourinary:  Has point tenderness and pain at pubic symphysis  Musculoskeletal: Normal range of motion.  Neurological: She is alert and oriented to person, place, and time.   Skin: Skin is warm and dry.  Psychiatric: She has a normal mood and affect.    MAU Course  Procedures Results for orders placed during the hospital encounter of 11/05/13 (from the past 24 hour(s))  URINALYSIS, ROUTINE W REFLEX MICROSCOPIC     Status: None   Collection Time    11/05/13  2:20 PM      Result Value Ref Range   Color, Urine YELLOW  YELLOW   APPearance CLEAR  CLEAR   Specific Gravity, Urine 1.015  1.005 - 1.030   pH 8.0  5.0 - 8.0   Glucose, UA NEGATIVE  NEGATIVE mg/dL   Hgb urine dipstick NEGATIVE  NEGATIVE   Bilirubin Urine NEGATIVE  NEGATIVE   Ketones, ur NEGATIVE  NEGATIVE mg/dL   Protein, ur NEGATIVE  NEGATIVE mg/dL   Urobilinogen, UA 1.0  0.0 - 1.0 mg/dL   Nitrite NEGATIVE  NEGATIVE   Leukocytes, UA NEGATIVE  NEGATIVE    MDM Discussed at length pubic symphysis discomfort.  Demonstrated 2 exercises to do at home to stretch low back and hips to relieve muscle tension which may be contributing to the pubic symphysis discomfort.  Assessment and Plan  Pubic symphysis pain at 27 weeks  Plan Perform exercises at home as instructed. Take Tylenol 325 mg 2 tablets by mouth every 4 hours if needed for pain. Drink at least 8 8-oz glasses of water every day. Keep your appointment in the office  on Monday. Consider appointment with a chiropractor if pain is not lessened with the exercises.   BURLESON,TERRI 11/05/2013, 3:12 PM

## 2013-12-24 ENCOUNTER — Inpatient Hospital Stay (HOSPITAL_COMMUNITY)
Admission: AD | Admit: 2013-12-24 | Discharge: 2013-12-28 | DRG: 778 | Disposition: A | Discharge status: Home / Self Care | Payer: Medicaid Other | Source: Ambulatory Visit | Attending: Obstetrics | Admitting: Obstetrics

## 2013-12-24 ENCOUNTER — Encounter (HOSPITAL_COMMUNITY): Payer: Self-pay | Admitting: *Deleted

## 2013-12-24 DIAGNOSIS — O4700 False labor before 37 completed weeks of gestation, unspecified trimester: Principal | ICD-10-CM | POA: Diagnosis present

## 2013-12-24 DIAGNOSIS — O479 False labor, unspecified: Secondary | ICD-10-CM | POA: Diagnosis present

## 2013-12-24 DIAGNOSIS — O4703 False labor before 37 completed weeks of gestation, third trimester: Secondary | ICD-10-CM

## 2013-12-24 LAB — URINALYSIS, ROUTINE W REFLEX MICROSCOPIC
Bilirubin Urine: NEGATIVE
Glucose, UA: NEGATIVE mg/dL
KETONES UR: NEGATIVE mg/dL
LEUKOCYTES UA: NEGATIVE
NITRITE: NEGATIVE
PROTEIN: NEGATIVE mg/dL
Specific Gravity, Urine: 1.005 (ref 1.005–1.030)
Urobilinogen, UA: 0.2 mg/dL (ref 0.0–1.0)
pH: 7 (ref 5.0–8.0)

## 2013-12-24 LAB — URINE MICROSCOPIC-ADD ON

## 2013-12-24 NOTE — MAU Provider Note (Signed)
Chief Complaint:  Abdominal Pain   First Provider Initiated Contact with Patient 12/24/13 2315      HPI: Mariah Leonard is a 18 y.o. G1P0 at 37w0dwho presents to maternity admissions reporting lower abdominal cramping off and on all day today with worsening after she ate 3 hours ago.  Her pain is worsening while in MAU, and she is crying with pain after 2 hours in MAU.  She reports good fetal movement, denies LOF, vaginal bleeding, vaginal itching/burning, urinary symptoms, h/a, dizziness, n/v, or fever/chills.    Past Medical History: Past Medical History  Diagnosis Date  . Medical history non-contributory   . Infection     UTI    Past obstetric history: OB History  Gravida Para Term Preterm AB SAB TAB Ectopic Multiple Living  1             # Outcome Date GA Lbr Len/2nd Weight Sex Delivery Anes PTL Lv  1 CUR               Past Surgical History: Past Surgical History  Procedure Laterality Date  . No past surgeries      Family History: History reviewed. No pertinent family history.  Social History: History  Substance Use Topics  . Smoking status: Never Smoker   . Smokeless tobacco: Never Used  . Alcohol Use: No    Allergies: No Known Allergies  Meds:  Prescriptions prior to admission  Medication Sig Dispense Refill  . Prenatal Vit-Fe Fumarate-FA (PRENATAL MULTIVITAMIN) TABS tablet Take 1 tablet by mouth every morning.        ROS: Pertinent findings in history of present illness.  Physical Exam  Blood pressure 114/62, pulse 65, temperature 98.7 F (37.1 C), resp. rate 20, height 5\' 5"  (1.651 m), weight 66.951 kg (147 lb 9.6 oz). GENERAL: Well-developed, well-nourished female in no acute distress.  HEENT: normocephalic HEART: normal rate RESP: normal effort ABDOMEN: Soft, non-tender, gravid appropriate for gestational age EXTREMITIES: Nontender, no edema NEURO: alert and oriented  Dilation: 1 Effacement (%): 60 Cervical Position: Posterior Exam by::  L Leftwich-Kirby CNM Posterior, -3  Cervix rechecked in 1.5 hours. Cervix unchanged and feels more like 50-60% effaced.  FHT:  Baseline 125 , moderate variability, accelerations present, no decelerations Contractions: irritability, every 2-4 minutes, irregular, mild to palpation   Labs: Results for orders placed during the hospital encounter of 12/24/13 (from the past 24 hour(s))  URINALYSIS, ROUTINE W REFLEX MICROSCOPIC     Status: Abnormal   Collection Time    12/24/13 10:48 PM      Result Value Ref Range   Color, Urine YELLOW  YELLOW   APPearance CLEAR  CLEAR   Specific Gravity, Urine 1.005  1.005 - 1.030   pH 7.0  5.0 - 8.0   Glucose, UA NEGATIVE  NEGATIVE mg/dL   Hgb urine dipstick TRACE (*) NEGATIVE   Bilirubin Urine NEGATIVE  NEGATIVE   Ketones, ur NEGATIVE  NEGATIVE mg/dL   Protein, ur NEGATIVE  NEGATIVE mg/dL   Urobilinogen, UA 0.2  0.0 - 1.0 mg/dL   Nitrite NEGATIVE  NEGATIVE   Leukocytes, UA NEGATIVE  NEGATIVE  URINE MICROSCOPIC-ADD ON     Status: Abnormal   Collection Time    12/24/13 10:48 PM      Result Value Ref Range   Squamous Epithelial / LPF FEW (*) RARE   WBC, UA 0-2  <3 WBC/hpf   RBC / HPF 3-6  <3 RBC/hpf   Bacteria, UA FEW (*)  RARE  WET PREP, GENITAL     Status: Abnormal   Collection Time    12/25/13  1:05 AM      Result Value Ref Range   Yeast Wet Prep HPF POC NONE SEEN  NONE SEEN   Trich, Wet Prep NONE SEEN  NONE SEEN   Clue Cells Wet Prep HPF POC FEW (*) NONE SEEN   WBC, Wet Prep HPF POC FEW (*) NONE SEEN     Assessment: 1. Preterm contractions, third trimester     Plan: Procardia 20 mg PO x1 dose in MAU Terbutaline 0.25 x1 dose in MAU Pt continues to report painful contractions, becoming more painful while in MAU Consult Dr Clearance CootsHarper Admit for observation Procardia 20 mg x 3 doses Q 20 minutes PRN Morphine 10 mg IM, Phenergan 25 mg IM Continuous EFM and toco    Medication List    ASK your doctor about these medications        prenatal multivitamin Tabs tablet  Take 1 tablet by mouth every morning.        Sharen CounterLisa Leftwich-Kirby Certified Nurse-Midwife 12/25/2013 4:13 AM

## 2013-12-24 NOTE — MAU Note (Signed)
Abd cramps for 3 hours. Unable to sleep due to pain. Denies leaking fld or bleeding

## 2013-12-25 ENCOUNTER — Encounter (HOSPITAL_COMMUNITY): Payer: Self-pay | Admitting: General Practice

## 2013-12-25 DIAGNOSIS — O4700 False labor before 37 completed weeks of gestation, unspecified trimester: Secondary | ICD-10-CM | POA: Diagnosis present

## 2013-12-25 DIAGNOSIS — O479 False labor, unspecified: Secondary | ICD-10-CM | POA: Diagnosis present

## 2013-12-25 LAB — WET PREP, GENITAL
TRICH WET PREP: NONE SEEN
Yeast Wet Prep HPF POC: NONE SEEN

## 2013-12-25 MED ORDER — CALCIUM CARBONATE ANTACID 500 MG PO CHEW
2.0000 | CHEWABLE_TABLET | ORAL | Status: DC | PRN
Start: 2013-12-25 — End: 2013-12-28

## 2013-12-25 MED ORDER — PRENATAL MULTIVITAMIN CH
1.0000 | ORAL_TABLET | Freq: Every day | ORAL | Status: DC
  Administered 2013-12-25 – 2013-12-27 (×3): 1 via ORAL
  Filled 2013-12-25 (×3): qty 1

## 2013-12-25 MED ORDER — DOCUSATE SODIUM 100 MG PO CAPS
100.0000 mg | ORAL_CAPSULE | Freq: Every day | ORAL | Status: DC
  Administered 2013-12-25 – 2013-12-26 (×2): 100 mg via ORAL
  Filled 2013-12-25 (×3): qty 1

## 2013-12-25 MED ORDER — CLINDAMYCIN PHOSPHATE 900 MG/50ML IV SOLN
900.0000 mg | Freq: Three times a day (TID) | INTRAVENOUS | Status: DC
  Administered 2013-12-25 – 2013-12-26 (×3): 900 mg via INTRAVENOUS
  Filled 2013-12-25 (×5): qty 50

## 2013-12-25 MED ORDER — ONDANSETRON HCL 4 MG/2ML IJ SOLN
4.0000 mg | INTRAMUSCULAR | Status: AC
  Administered 2013-12-25: 4 mg via INTRAVENOUS
  Filled 2013-12-25: qty 2

## 2013-12-25 MED ORDER — NIFEDIPINE 10 MG PO CAPS
20.0000 mg | ORAL_CAPSULE | ORAL | Status: DC | PRN
  Administered 2013-12-26: 20 mg via ORAL
  Filled 2013-12-25 (×2): qty 2

## 2013-12-25 MED ORDER — NIFEDIPINE 10 MG PO CAPS
20.0000 mg | ORAL_CAPSULE | ORAL | Status: AC
Start: 2013-12-25 — End: 2013-12-25
  Administered 2013-12-25: 20 mg via ORAL
  Filled 2013-12-25: qty 2

## 2013-12-25 MED ORDER — LACTATED RINGERS IV BOLUS (SEPSIS)
1000.0000 mL | Freq: Once | INTRAVENOUS | Status: AC
  Administered 2013-12-25: 1000 mL via INTRAVENOUS

## 2013-12-25 MED ORDER — LACTATED RINGERS IV SOLN
INTRAVENOUS | Status: DC
  Administered 2013-12-25 – 2013-12-26 (×6): via INTRAVENOUS

## 2013-12-25 MED ORDER — ZOLPIDEM TARTRATE 5 MG PO TABS
5.0000 mg | ORAL_TABLET | Freq: Every evening | ORAL | Status: DC | PRN
  Administered 2013-12-27 – 2013-12-28 (×2): 5 mg via ORAL
  Filled 2013-12-25 (×2): qty 1

## 2013-12-25 MED ORDER — PROMETHAZINE HCL 25 MG/ML IJ SOLN
25.0000 mg | Freq: Four times a day (QID) | INTRAMUSCULAR | Status: DC | PRN
  Administered 2013-12-25: 25 mg via INTRAMUSCULAR
  Filled 2013-12-25 (×2): qty 1

## 2013-12-25 MED ORDER — ACETAMINOPHEN 325 MG PO TABS
650.0000 mg | ORAL_TABLET | ORAL | Status: DC | PRN
  Administered 2013-12-27: 650 mg via ORAL
  Filled 2013-12-25: qty 2

## 2013-12-25 MED ORDER — MORPHINE SULFATE 10 MG/ML IJ SOLN
10.0000 mg | INTRAMUSCULAR | Status: AC
  Administered 2013-12-25: 10 mg via INTRAMUSCULAR
  Filled 2013-12-25: qty 1

## 2013-12-25 MED ORDER — TERBUTALINE SULFATE 1 MG/ML IJ SOLN
0.2500 mg | INTRAMUSCULAR | Status: AC
Start: 2013-12-25 — End: 2013-12-25
  Administered 2013-12-25: 0.25 mg via SUBCUTANEOUS
  Filled 2013-12-25: qty 1

## 2013-12-25 MED ORDER — PROMETHAZINE HCL 25 MG/ML IJ SOLN
25.0000 mg | Freq: Four times a day (QID) | INTRAMUSCULAR | Status: DC | PRN
  Administered 2013-12-25: 25 mg via INTRAVENOUS

## 2013-12-25 NOTE — Progress Notes (Signed)
Monitors off for shower and eating.

## 2013-12-25 NOTE — Progress Notes (Signed)
Pt rating her pain level an 8 when she feels contractions.  Pt eating large dinner bought in by mother and socializing.  Pt requires more education on labor, pain and signs and symptoms to report.

## 2013-12-25 NOTE — Progress Notes (Signed)
Wet prep sent.   

## 2013-12-25 NOTE — H&P (Signed)
Santa Generaanaysia Y Ramp is a 18 y.o. female presenting for UC's. Maternal Medical History:  Reason for admission: Contractions.   Contractions: Onset was 6-12 hours ago.    Fetal activity: Perceived fetal activity is normal.   Last perceived fetal movement was within the past hour.      OB History   Grav Para Term Preterm Abortions TAB SAB Ect Mult Living   1              Past Medical History  Diagnosis Date  . Medical history non-contributory   . Infection     UTI   Past Surgical History  Procedure Laterality Date  . No past surgeries     Family History: family history is not on file. Social History:  reports that she has never smoked. She has never used smokeless tobacco. She reports that she does not drink alcohol or use illicit drugs.   Prenatal Transfer Tool  Maternal Diabetes: No Genetic Screening: Normal Maternal Ultrasounds/Referrals: Normal Fetal Ultrasounds or other Referrals:  None Maternal Substance Abuse:  No Significant Maternal Medications:  Meds include: Other:  Significant Maternal Lab Results:  None Other Comments:  None  Review of Systems  All other systems reviewed and are negative.   Dilation: 1 Effacement (%): 60 Exam by:: L Leftwich-Kirby CNM Blood pressure 90/46, pulse 70, temperature 98.5 F (36.9 C), temperature source Oral, resp. rate 20, height 5\' 5"  (1.651 m), weight 147 lb 9.6 oz (66.951 kg). Maternal Exam:  Uterine Assessment: Contraction strength is mild.  Contraction frequency is irregular.   Abdomen: Patient reports no abdominal tenderness. Fetal presentation: vertex  Pelvis: adequate for delivery.   Cervix: Cervix evaluated by digital exam.     Physical Exam  Nursing note and vitals reviewed. Constitutional: She is oriented to person, place, and time. She appears well-developed and well-nourished.  HENT:  Head: Normocephalic and atraumatic.  Eyes: Conjunctivae are normal. Pupils are equal, round, and reactive to light.   Neck: Normal range of motion. Neck supple.  Cardiovascular: Normal rate and regular rhythm.   Respiratory: Effort normal.  GI: Soft.  Musculoskeletal: Normal range of motion.  Neurological: She is alert and oriented to person, place, and time.  Skin: Skin is warm and dry.  Psychiatric: She has a normal mood and affect. Her behavior is normal. Judgment and thought content normal.    Prenatal labs: ABO, Rh: --/--/A POS (02/02 1958) Antibody:   Rubella:   RPR:    HBsAg:    HIV:    GBS:     Assessment/Plan: 34 weeks.  UC's.  R/O labor.  Admit for observation.   Candie Gintz A 12/25/2013, 3:54 PM

## 2013-12-25 NOTE — Progress Notes (Signed)
Patient is sleeping, lying on toco. Will readjust once awake.

## 2013-12-25 NOTE — Progress Notes (Signed)
Patient sleeping, monitors adjusted after patient awoke.

## 2013-12-25 NOTE — Progress Notes (Signed)
Patient nauseated

## 2013-12-26 DIAGNOSIS — O4700 False labor before 37 completed weeks of gestation, unspecified trimester: Secondary | ICD-10-CM | POA: Diagnosis present

## 2013-12-26 DIAGNOSIS — O479 False labor, unspecified: Secondary | ICD-10-CM | POA: Diagnosis present

## 2013-12-26 MED ORDER — SODIUM CHLORIDE 0.9 % IJ SOLN
3.0000 mL | Freq: Two times a day (BID) | INTRAMUSCULAR | Status: DC
  Administered 2013-12-27 (×2): 3 mL via INTRAVENOUS

## 2013-12-26 MED ORDER — TERBUTALINE SULFATE 1 MG/ML IJ SOLN: 0.2500 mg | Freq: Once | INTRAMUSCULAR | Status: DC

## 2013-12-26 MED ORDER — SODIUM CHLORIDE 0.9 % IJ SOLN: 3.0000 mL | Freq: Two times a day (BID) | INTRAMUSCULAR | Status: DC

## 2013-12-26 MED ORDER — TERBUTALINE SULFATE 1 MG/ML IJ SOLN
INTRAMUSCULAR | Status: AC
  Filled 2013-12-26: qty 1

## 2013-12-26 MED ORDER — NIFEDIPINE 10 MG PO CAPS
10.0000 mg | ORAL_CAPSULE | Freq: Four times a day (QID) | ORAL | Status: DC
  Administered 2013-12-26 – 2013-12-28 (×9): 10 mg via ORAL
  Filled 2013-12-26 (×8): qty 1

## 2013-12-26 NOTE — Progress Notes (Signed)
Ur chart review completed.  

## 2013-12-26 NOTE — Progress Notes (Signed)
Patient ID: Mariah Leonard, female   DOB: 05/18/1995, 18 y.o.   MRN: [id number] Vital signs normal Patient has had episodes of irritability and she had no contractions last night however this morning she started having irritability so she was started on Procardia 10 mg by mouth every 6 hours

## 2013-12-26 NOTE — Progress Notes (Signed)
Called dr Gaynell Facemarshall informed of UC's and UI, to give pt her procardia now and 0.25mg  of terbutaline SQ.

## 2013-12-26 NOTE — Progress Notes (Signed)
Dr Gaynell Facemarshall called for update on pt status, informed of pt hurting and placed on monitor, to call him back after assessing uc's.

## 2013-12-27 NOTE — Progress Notes (Signed)
Patient ID: Mariah Leonard, female   DOB: 07/12/1995, 18 y.o.   MRN: [id number] Vital signs normal No leakage Has been having runs her contractions she got to dose of terbutaline at 5 PM yesterday and occasionally the throat the night she had some contractions the Procardia 10 by mouth every 6 hours continue present therapy

## 2013-12-28 NOTE — Discharge Summary (Signed)
Patient admitted at 34+ weeks with premature contractions and since she's been here she's been on Procardia 10 mg by mouth every 6 hours and is much better she'll be discharged home today to 5 weeks to see me in one week

## 2013-12-28 NOTE — Discharge Instructions (Signed)
Discharge instructions   You can wash your hair  Shower  Eat what you want  Drink what you want  See me in 6 weeks  Your ankles are going to swell more in the next 2 weeks than when pregnant  No sex for 6 weeks   Noam Franzen A, MD 12/28/2013

## 2013-12-29 ENCOUNTER — Encounter (HOSPITAL_COMMUNITY): Payer: Self-pay

## 2013-12-29 ENCOUNTER — Inpatient Hospital Stay (HOSPITAL_COMMUNITY)
Admission: AD | Admit: 2013-12-29 | Discharge: 2013-12-29 | Disposition: A | Discharge status: Home / Self Care | Payer: Medicaid Other | Source: Ambulatory Visit | Attending: Obstetrics | Admitting: Obstetrics

## 2013-12-29 DIAGNOSIS — O4700 False labor before 37 completed weeks of gestation, unspecified trimester: Secondary | ICD-10-CM | POA: Insufficient documentation

## 2013-12-29 DIAGNOSIS — O4703 False labor before 37 completed weeks of gestation, third trimester: Secondary | ICD-10-CM

## 2013-12-29 LAB — URINE MICROSCOPIC-ADD ON

## 2013-12-29 LAB — URINALYSIS, ROUTINE W REFLEX MICROSCOPIC
Bilirubin Urine: NEGATIVE
Glucose, UA: NEGATIVE mg/dL
Ketones, ur: NEGATIVE mg/dL
Nitrite: NEGATIVE
Protein, ur: NEGATIVE mg/dL
Specific Gravity, Urine: <1.005 — ABNORMAL LOW (ref 1.005–1.030)
UROBILINOGEN UA: 0.2 mg/dL (ref 0.0–1.0)
pH: 7 (ref 5.0–8.0)

## 2013-12-29 MED ORDER — TERBUTALINE SULFATE 1 MG/ML IJ SOLN
0.2500 mg | Freq: Once | INTRAMUSCULAR | Status: AC
  Administered 2013-12-29: 0.25 mg via SUBCUTANEOUS
  Filled 2013-12-29: qty 1

## 2013-12-29 MED ORDER — LACTATED RINGERS IV BOLUS (SEPSIS): 1000.0000 mL | Freq: Once | INTRAVENOUS | Status: DC

## 2013-12-29 NOTE — MAU Note (Signed)
Pt discharged from antenatal yesterday. Contractions woke her up around 10pm much stronger than they have been. Denies LOF, vag bleeding. +FM. Last took procardia between 7-8pm.

## 2013-12-29 NOTE — Discharge Instructions (Signed)
Braxton Hicks Contractions °Contractions of the uterus can occur throughout pregnancy. Contractions are not always a sign that you are in labor.  °WHAT ARE BRAXTON HICKS CONTRACTIONS?  °Contractions that occur before labor are called Braxton Hicks contractions, or false labor. Toward the end of pregnancy (32-34 weeks), these contractions can develop more often and may become more forceful. This is not true labor because these contractions do not result in opening (dilatation) and thinning of the cervix. They are sometimes difficult to tell apart from true labor because these contractions can be forceful and people have different pain tolerances. You should not feel embarrassed if you go to the hospital with false labor. Sometimes, the only way to tell if you are in true labor is for your health care provider to look for changes in the cervix. °If there are no prenatal problems or other health problems associated with the pregnancy, it is completely safe to be sent home with false labor and await the onset of true labor. °HOW CAN YOU TELL THE DIFFERENCE BETWEEN TRUE AND FALSE LABOR? °False Labor °· The contractions of false labor are usually shorter and not as hard as those of true labor.   °· The contractions are usually irregular.   °· The contractions are often felt in the front of the lower abdomen and in the groin.   °· The contractions may go away when you walk around or change positions while lying down.   °· The contractions get weaker and are shorter lasting as time goes on.   °· The contractions do not usually become progressively stronger, regular, and closer together as with true labor.   °True Labor °· Contractions in true labor last 30-70 seconds, become very regular, usually become more intense, and increase in frequency.   °· The contractions do not go away with walking.   °· The discomfort is usually felt in the top of the uterus and spreads to the lower abdomen and low back.   °· True labor can be  determined by your health care provider with an exam. This will show that the cervix is dilating and getting thinner.   °WHAT TO REMEMBER °· Keep up with your usual exercises and follow other instructions given by your health care provider.   °· Take medicines as directed by your health care provider.   °· Keep your regular prenatal appointments.   °· Eat and drink lightly if you think you are going into labor.   °· If Braxton Hicks contractions are making you uncomfortable:   °· Change your position from lying down or resting to walking, or from walking to resting.   °· Sit and rest in a tub of warm water.   °· Drink 2-3 glasses of water. Dehydration may cause these contractions.   °· Do slow and deep breathing several times an hour.   °WHEN SHOULD I SEEK IMMEDIATE MEDICAL CARE? °Seek immediate medical care if: °· Your contractions become stronger, more regular, and closer together.   °· You have fluid leaking or gushing from your vagina.   °· You have a fever.   °· You pass blood-tinged mucus.   °· You have vaginal bleeding.   °· You have continuous abdominal pain.   °· You have low back pain that you never had before.   °· You feel your baby's head pushing down and causing pelvic pressure.   °· Your baby is not moving as much as it used to.   °Document Released: 04/28/2005 Document Revised: 05/03/2013 Document Reviewed: 02/07/2013 °[employer]® Patient Information ©2015 [employer]. This information is not intended to replace advice given to you by your health care   provider. Make sure you discuss any questions you have with your health care provider. °Preterm Labor Information °Preterm labor is when labor starts at less than 37 weeks of pregnancy. The normal length of a pregnancy is 39 to 41 weeks. °CAUSES °Often, there is no identifiable underlying cause as to why a woman goes into preterm labor. One of the most common known causes of preterm labor is infection. Infections of the uterus, cervix, vagina, amniotic  sac, bladder, kidney, or even the lungs (pneumonia) can cause labor to start. Other suspected causes of preterm labor include:  °· Urogenital infections, such as yeast infections and bacterial vaginosis.   °· Uterine abnormalities (uterine shape, uterine septum, fibroids, or bleeding from the placenta).   °· A cervix that has been operated on (it may fail to stay closed).   °· Malformations in the fetus.   °· Multiple gestations (twins, triplets, and so on).   °· Breakage of the amniotic sac.   °RISK FACTORS °· Having a previous history of preterm labor.   °· Having premature rupture of membranes (PROM).   °· Having a placenta that covers the opening of the cervix (placenta previa).   °· Having a placenta that separates from the uterus (placental abruption).   °· Having a cervix that is too weak to hold the fetus in the uterus (incompetent cervix).   °· Having too much fluid in the amniotic sac (polyhydramnios).   °· Taking illegal drugs or smoking while pregnant.   °· Not gaining enough weight while pregnant.   °· Being younger than 18 and older than 20 years old.   °· Having a low socioeconomic status.   °· Being African American. °SYMPTOMS °Signs and symptoms of preterm labor include:  °· Menstrual-like cramps, abdominal pain, or back pain. °· Uterine contractions that are regular, as frequent as six in an hour, regardless of their intensity (may be mild or painful). °· Contractions that start on the top of the uterus and spread down to the lower abdomen and back.   °· A sense of increased pelvic pressure.   °· A watery or bloody mucus discharge that comes from the vagina.   °TREATMENT °Depending on the length of the pregnancy and other circumstances, your health care provider may suggest bed rest. If necessary, there are medicines that can be given to stop contractions and to mature the fetal lungs. If labor happens before 34 weeks of pregnancy, a prolonged hospital stay may be recommended. Treatment depends on  the condition of both you and the fetus.  °WHAT SHOULD YOU DO IF YOU THINK YOU ARE IN PRETERM LABOR? °Call your health care provider right away. You will need to go to the hospital to get checked immediately. °HOW CAN YOU PREVENT PRETERM LABOR IN FUTURE PREGNANCIES? °You should:  °· Stop smoking if you smoke.  °· Maintain healthy weight gain and avoid chemicals and drugs that are not necessary. °· Be watchful for any type of infection. °· Inform your health care provider if you have a known history of preterm labor. °Document Released: 07/19/2003 Document Revised: 12/29/2012 Document Reviewed: 05/31/2012 °[employer]® Patient Information ©2015 [employer]. This information is not intended to replace advice given to you by your health care provider. Make sure you discuss any questions you have with your health care provider. ° °

## 2013-12-29 NOTE — MAU Provider Note (Signed)
History     CSN: [id number]  Arrival date and time: 12/29/13 [id number]   First Provider Initiated Contact with Patient 12/29/13 0441      Chief Complaint  Patient presents with  . Contractions   HPI [name] is a 18 y.o. G1P0 at 444w5d who presents to MAU today with complaint of contractions. The patients was recently on Antenatal for preterm contractions and started on 10 mg procardia q 8 hours PRN and Percocet. The patient states last dose of Procardia was at 10 pm last night. She states consistent contractions since that time ~ q 5 minutes. She denies vaginal bleeding, LOF or N/V. She reports good fetal movement.   OB History   Grav Para Term Preterm Abortions TAB SAB Ect Mult Living   1               Past Medical History  Diagnosis Date  . Medical history non-contributory   . Infection     UTI    Past Surgical History  Procedure Laterality Date  . No past surgeries      History reviewed. No pertinent family history.  History  Substance Use Topics  . Smoking status: Never Smoker   . Smokeless tobacco: Never Used  . Alcohol Use: No    Allergies: No Known Allergies  Prescriptions prior to admission  Medication Sig Dispense Refill  . NIFEdipine (PROCARDIA) 10 MG capsule Take 10 mg by mouth 3 (three) times daily.      . Prenatal Vit-Fe Fumarate-FA (PRENATAL MULTIVITAMIN) TABS tablet Take 1 tablet by mouth daily at 12 noon.        Review of Systems  Constitutional: Negative for malaise/fatigue.  Gastrointestinal: Positive for abdominal pain. Negative for nausea and vomiting.  Genitourinary:       Neg - vaginal bleeding, discharge, LOF   Physical Exam   Blood pressure 121/74, pulse 76, temperature 98 F (36.7 C), temperature source Oral, resp. rate 20, height 5' 3.6" (1.615 m), weight 154 lb (69.854 kg), SpO2 100.00%.  Physical Exam  Constitutional: She is oriented to person, place, and time. She appears well-developed and well-nourished. No  distress.  HENT:  Head: Normocephalic.  Cardiovascular: Normal rate, regular rhythm and normal heart sounds.   Respiratory: Effort normal and breath sounds normal. No respiratory distress.  GI: Soft. She exhibits no distension and no mass. There is no tenderness. There is no rebound and no guarding.  Neurological: She is alert and oriented to person, place, and time.  Skin: Skin is warm and dry. No erythema.  Psychiatric: She has a normal mood and affect.  Dilation: 1 Effacement (%): 50 Cervical Position: Posterior Station: Ballotable Exam by:: Naaman PlummerJ Ethier PA  Results for orders placed during the hospital encounter of 12/29/13 (from the past 24 hour(s))  URINALYSIS, ROUTINE W REFLEX MICROSCOPIC     Status: Abnormal   Collection Time    12/29/13  4:21 AM      Result Value Ref Range   Color, Urine YELLOW  YELLOW   APPearance CLEAR  CLEAR   Specific Gravity, Urine <1.005 (*) 1.005 - 1.030   pH 7.0  5.0 - 8.0   Glucose, UA NEGATIVE  NEGATIVE mg/dL   Hgb urine dipstick TRACE (*) NEGATIVE   Bilirubin Urine NEGATIVE  NEGATIVE   Ketones, ur NEGATIVE  NEGATIVE mg/dL   Protein, ur NEGATIVE  NEGATIVE mg/dL   Urobilinogen, UA 0.2  0.0 - 1.0 mg/dL   Nitrite NEGATIVE  NEGATIVE  Leukocytes, UA TRACE (*) NEGATIVE  URINE MICROSCOPIC-ADD ON     Status: Abnormal   Collection Time    12/29/13  4:21 AM      Result Value Ref Range   Squamous Epithelial / LPF MANY (*) RARE   WBC, UA 3-6  <3 WBC/hpf   RBC / HPF 3-6  <3 RBC/hpf   Bacteria, UA FEW (*) RARE   Fetal Monitoring: Baseline: 110 bpm, moderate variability, + accelerations, no decelerations Contractions: q 2-5 minutes, mildly palpable  MAU Course  Procedures None  MDM UA today Cervix unchanged from previous visit Discussed with Dr. Gaynell Face. PO hydration and 0.25 mg SQ Terbutaline today Dr. Gaynell Face states that terbutaline may not stop all contractions, but patient may be discharged and return if contractions persist or  worsen Prior to medication administration patient's mother called out stating that patient was complaining of chest pain and her breathing patterns were not normal I went to bedside to assess the patient. Pulse is 82, O2 sat is 100%, RRR on exam and lungs are clear to auscultation. Patient is still crying and sobbing with pain from contractions EKG ordered - Normal sinus rhythm Contractions have slowed to mild UI after Terbutaline given Patient reports improvement in pain  Assessment and Plan  A: [facility] at [redacted]w[redacted]d Preterm contractions  P: Discharge home Patient advised to continue current regimen of Procardia and Percocet Patient advised to follow-up with Dr. Gaynell Face as scheduled for routine prenatal care Labor precautions discussed Patient may return to MAU as needed or if her condition were to change or worsen   Freddi Starr, PA-C  12/29/2013, 4:41 AM

## 2014-01-07 ENCOUNTER — Inpatient Hospital Stay (HOSPITAL_COMMUNITY)
Admission: AD | Admit: 2014-01-07 | Discharge: 2014-01-08 | Disposition: A | Discharge status: Home / Self Care | Payer: Medicaid Other | Source: Ambulatory Visit | Attending: Obstetrics | Admitting: Obstetrics

## 2014-01-07 DIAGNOSIS — O4700 False labor before 37 completed weeks of gestation, unspecified trimester: Secondary | ICD-10-CM | POA: Insufficient documentation

## 2014-01-07 DIAGNOSIS — O368390 Maternal care for abnormalities of the fetal heart rate or rhythm, unspecified trimester, not applicable or unspecified: Secondary | ICD-10-CM

## 2014-01-08 ENCOUNTER — Encounter (HOSPITAL_COMMUNITY): Payer: Self-pay | Admitting: *Deleted

## 2014-01-08 ENCOUNTER — Inpatient Hospital Stay (HOSPITAL_COMMUNITY): Payer: Medicaid Other

## 2014-01-08 DIAGNOSIS — O4700 False labor before 37 completed weeks of gestation, unspecified trimester: Secondary | ICD-10-CM | POA: Diagnosis present

## 2014-01-08 LAB — URINALYSIS, ROUTINE W REFLEX MICROSCOPIC
BILIRUBIN URINE: NEGATIVE
Glucose, UA: NEGATIVE mg/dL
KETONES UR: NEGATIVE mg/dL
Nitrite: NEGATIVE
PROTEIN: NEGATIVE mg/dL
Specific Gravity, Urine: 1.01 (ref 1.005–1.030)
Urobilinogen, UA: 0.2 mg/dL (ref 0.0–1.0)
pH: 6.5 (ref 5.0–8.0)

## 2014-01-08 LAB — URINE MICROSCOPIC-ADD ON

## 2014-01-08 NOTE — MAU Note (Signed)
Pt. States that contractions started at 11 this am.  They have gotten more painful and closer together.  Pt. Denies any bleeding or srom.

## 2014-01-08 NOTE — MAU Note (Signed)
Discharge teaching complete, taught pt. About fetal kick counts and braxton hicks contractions.  pt. Voiced understanding.  Denies any needs at this time.

## 2014-01-08 NOTE — MAU Note (Signed)
Noticed decel when discontinued monitoring at discharge.  Asked pt. To come back to MAU to monitor baby a little longer.

## 2014-01-15 ENCOUNTER — Encounter (HOSPITAL_COMMUNITY): Payer: Self-pay

## 2014-01-15 ENCOUNTER — Inpatient Hospital Stay (HOSPITAL_COMMUNITY)
Admission: AD | Admit: 2014-01-15 | Discharge: 2014-01-16 | Disposition: A | Discharge status: Home / Self Care | Payer: Medicaid Other | Source: Ambulatory Visit | Attending: Obstetrics | Admitting: Obstetrics

## 2014-01-15 DIAGNOSIS — R300 Dysuria: Secondary | ICD-10-CM | POA: Diagnosis present

## 2014-01-15 DIAGNOSIS — O99891 Other specified diseases and conditions complicating pregnancy: Secondary | ICD-10-CM | POA: Diagnosis not present

## 2014-01-15 DIAGNOSIS — O9989 Other specified diseases and conditions complicating pregnancy, childbirth and the puerperium: Principal | ICD-10-CM

## 2014-01-15 LAB — URINALYSIS, ROUTINE W REFLEX MICROSCOPIC
Bilirubin Urine: NEGATIVE
Glucose, UA: NEGATIVE mg/dL
Ketones, ur: NEGATIVE mg/dL
LEUKOCYTES UA: NEGATIVE
Nitrite: NEGATIVE
PH: 6 (ref 5.0–8.0)
Protein, ur: NEGATIVE mg/dL
Specific Gravity, Urine: <1.005 — ABNORMAL LOW (ref 1.005–1.030)
UROBILINOGEN UA: 0.2 mg/dL (ref 0.0–1.0)

## 2014-01-15 LAB — URINE MICROSCOPIC-ADD ON

## 2014-01-15 MED ORDER — NITROFURANTOIN MONOHYD MACRO 100 MG PO CAPS: 100.0000 mg | ORAL_CAPSULE | Freq: Two times a day (BID) | ORAL | Status: DC

## 2014-01-15 NOTE — MAU Note (Signed)
Burning with urination today. Denies vaginal bleeding. Denies fever or increased urinary frequency. Positive fetal movement. Contractions every 2 minutes x 3 weeks.

## 2014-01-15 NOTE — MAU Provider Note (Signed)
History     CSN: [id number]  Arrival date and time: 01/15/14 2244   First Provider Initiated Contact with Patient 01/15/14 2339      Chief Complaint  Patient presents with  . Contractions  . Dysuria   Dysuria  Pertinent negatives include no chills, flank pain or hematuria.    Pt is a G1P0 at [redacted]w[redacted]d wks IUP here with report of burning with urination today. Denies vaginal bleeding. Denies fever or increased urinary frequency. No report of abnormal vaginal discharge, lesions, or vaginal itching.  Positive fetal movement. Contractions every 2 minutes x 3 weeks   Past Medical History  Diagnosis Date  . Infection     UTI    Past Surgical History  Procedure Laterality Date  . No past surgeries      History reviewed. No pertinent family history.  History  Substance Use Topics  . Smoking status: Never Smoker   . Smokeless tobacco: Never Used  . Alcohol Use: No    Allergies: No Known Allergies  Prescriptions prior to admission  Medication Sig Dispense Refill  . Prenatal Vit-Fe Fumarate-FA (PRENATAL MULTIVITAMIN) TABS tablet Take 1 tablet by mouth daily at 12 noon.        Review of Systems  Constitutional: Negative for fever and chills.  Gastrointestinal: Positive for abdominal pain (contractions).  Genitourinary: Positive for dysuria. Negative for hematuria and flank pain.  All other systems reviewed and are negative.  Physical Exam   Blood pressure 111/78, pulse 76, temperature 98.8 F (37.1 C), temperature source Oral, resp. rate 20, height  (1.676 m), weight 69.673 kg (153 lb 9.6 oz), SpO2 99.00%.  Physical Exam  Constitutional: She is oriented to person, place, and time. She appears well-developed and well-nourished.  HENT:  Head: Normocephalic.  Neck: Normal range of motion. Neck supple.  Cardiovascular: Normal rate, regular rhythm and normal heart sounds.   Respiratory: Effort normal and breath sounds normal.  Genitourinary: No bleeding around the  vagina. Vaginal discharge (mucusy) found.  Neurological: She is alert and oriented to person, place, and time.  Skin: Skin is warm and dry.    MAU Course  Procedures Recent Results (from the past 2160 hour(s))  URINALYSIS, ROUTINE W REFLEX MICROSCOPIC     Status: None   Collection Time    11/05/13  2:20 PM      Result Value Ref Range   Color, Urine YELLOW  YELLOW   APPearance CLEAR  CLEAR   Specific Gravity, Urine 1.015  1.005 - 1.030   pH 8.0  5.0 - 8.0   Glucose, UA NEGATIVE  NEGATIVE mg/dL   Hgb urine dipstick NEGATIVE  NEGATIVE   Bilirubin Urine NEGATIVE  NEGATIVE   Ketones, ur NEGATIVE  NEGATIVE mg/dL   Protein, ur NEGATIVE  NEGATIVE mg/dL   Urobilinogen, UA 1.0  0.0 - 1.0 mg/dL   Nitrite NEGATIVE  NEGATIVE   Leukocytes, UA NEGATIVE  NEGATIVE   Comment: MICROSCOPIC NOT DONE ON URINES WITH NEGATIVE PROTEIN, BLOOD, LEUKOCYTES, NITRITE, OR GLUCOSE <1000 mg/dL.  URINALYSIS, ROUTINE W REFLEX MICROSCOPIC     Status: Abnormal   Collection Time    12/24/13 10:48 PM      Result Value Ref Range   Color, Urine YELLOW  YELLOW   APPearance CLEAR  CLEAR   Specific Gravity, Urine 1.005  1.005 - 1.030   pH 7.0  5.0 - 8.0   Glucose, UA NEGATIVE  NEGATIVE mg/dL   Hgb urine dipstick TRACE (*) NEGATIVE  Bilirubin Urine NEGATIVE  NEGATIVE   Ketones, ur NEGATIVE  NEGATIVE mg/dL   Protein, ur NEGATIVE  NEGATIVE mg/dL   Urobilinogen, UA 0.2  0.0 - 1.0 mg/dL   Nitrite NEGATIVE  NEGATIVE   Leukocytes, UA NEGATIVE  NEGATIVE  URINE MICROSCOPIC-ADD ON     Status: Abnormal   Collection Time    12/24/13 10:48 PM      Result Value Ref Range   Squamous Epithelial / LPF FEW (*) RARE   WBC, UA 0-2  <3 WBC/hpf   RBC / HPF 3-6  <3 RBC/hpf   Bacteria, UA FEW (*) RARE  WET PREP, GENITAL     Status: Abnormal   Collection Time    12/25/13  1:05 AM      Result Value Ref Range   Yeast Wet Prep HPF POC NONE SEEN  NONE SEEN   Trich, Wet Prep NONE SEEN  NONE SEEN   Clue Cells Wet Prep HPF POC FEW (*)  NONE SEEN   WBC, Wet Prep HPF POC FEW (*) NONE SEEN   Comment: MODERATE BACTERIA SEEN  URINALYSIS, ROUTINE W REFLEX MICROSCOPIC     Status: Abnormal   Collection Time    12/29/13  4:21 AM      Result Value Ref Range   Color, Urine YELLOW  YELLOW   APPearance CLEAR  CLEAR   Specific Gravity, Urine <1.005 (*) 1.005 - 1.030   pH 7.0  5.0 - 8.0   Glucose, UA NEGATIVE  NEGATIVE mg/dL   Hgb urine dipstick TRACE (*) NEGATIVE   Bilirubin Urine NEGATIVE  NEGATIVE   Ketones, ur NEGATIVE  NEGATIVE mg/dL   Protein, ur NEGATIVE  NEGATIVE mg/dL   Urobilinogen, UA 0.2  0.0 - 1.0 mg/dL   Nitrite NEGATIVE  NEGATIVE   Leukocytes, UA TRACE (*) NEGATIVE  URINE MICROSCOPIC-ADD ON     Status: Abnormal   Collection Time    12/29/13  4:21 AM      Result Value Ref Range   Squamous Epithelial / LPF MANY (*) RARE   WBC, UA 3-6  <3 WBC/hpf   RBC / HPF 3-6  <3 RBC/hpf   Bacteria, UA FEW (*) RARE  URINALYSIS, ROUTINE W REFLEX MICROSCOPIC     Status: Abnormal   Collection Time    01/08/14 12:08 AM      Result Value Ref Range   Color, Urine YELLOW  YELLOW   APPearance HAZY (*) CLEAR   Specific Gravity, Urine 1.010  1.005 - 1.030   pH 6.5  5.0 - 8.0   Glucose, UA NEGATIVE  NEGATIVE mg/dL   Hgb urine dipstick TRACE (*) NEGATIVE   Bilirubin Urine NEGATIVE  NEGATIVE   Ketones, ur NEGATIVE  NEGATIVE mg/dL   Protein, ur NEGATIVE  NEGATIVE mg/dL   Urobilinogen, UA 0.2  0.0 - 1.0 mg/dL   Nitrite NEGATIVE  NEGATIVE   Leukocytes, UA TRACE (*) NEGATIVE  URINE MICROSCOPIC-ADD ON     Status: Abnormal   Collection Time    01/08/14 12:08 AM      Result Value Ref Range   Squamous Epithelial / LPF MANY (*) RARE   WBC, UA 0-2  <3 WBC/hpf   RBC / HPF 0-2  <3 RBC/hpf   Bacteria, UA RARE  RARE  URINALYSIS, ROUTINE W REFLEX MICROSCOPIC     Status: Abnormal   Collection Time    01/15/14 11:00 PM      Result Value Ref Range   Color, Urine YELLOW  YELLOW  APPearance CLEAR  CLEAR   Specific Gravity, Urine <1.005 (*)  1.005 - 1.030   pH 6.0  5.0 - 8.0   Glucose, UA NEGATIVE  NEGATIVE mg/dL   Hgb urine dipstick TRACE (*) NEGATIVE   Bilirubin Urine NEGATIVE  NEGATIVE   Ketones, ur NEGATIVE  NEGATIVE mg/dL   Protein, ur NEGATIVE  NEGATIVE mg/dL   Urobilinogen, UA 0.2  0.0 - 1.0 mg/dL   Nitrite NEGATIVE  NEGATIVE   Leukocytes, UA NEGATIVE  NEGATIVE  URINE MICROSCOPIC-ADD ON     Status: Abnormal   Collection Time    01/15/14 11:00 PM      Result Value Ref Range   Squamous Epithelial / LPF FEW (*) RARE   WBC, UA 3-6  <3 WBC/hpf   RBC / HPF 0-2  <3 RBC/hpf   Bacteria, UA FEW (*) RARE  URINE CULTURE     Status: None   Collection Time    01/15/14 11:00 PM      Result Value Ref Range   Specimen Description URINE, RANDOM     Special Requests NONE     Culture  Setup Time       Value: 01/16/2014 11:07     Performed at [facility] Count       Value: 50,000 COLONIES/ML     Performed at [facility]   Culture       Value: Multiple bacterial morphotypes present, none predominant. Suggest appropriate recollection if clinically indicated.     Performed at [facility]   Report Status 01/17/2014 FINAL     Assessment and Plan  Pt is a G1P0 at [redacted]w[redacted]d wks IUP Dysuria Category I FHR Tracing  Plan: Discharge to home RX Macrobid Urine culture sent Keep scheduled appointment  Rochele Pages N 01/15/2014, 11:39 PM

## 2014-01-17 ENCOUNTER — Inpatient Hospital Stay (HOSPITAL_COMMUNITY)
Admission: AD | Admit: 2014-01-17 | Discharge: 2014-01-18 | Disposition: A | Discharge status: Home / Self Care | Payer: Medicaid Other | Source: Ambulatory Visit | Attending: Obstetrics | Admitting: Obstetrics

## 2014-01-17 DIAGNOSIS — O479 False labor, unspecified: Secondary | ICD-10-CM | POA: Insufficient documentation

## 2014-01-17 LAB — URINE CULTURE: Colony Count: 50000

## 2014-01-18 ENCOUNTER — Encounter (HOSPITAL_COMMUNITY): Payer: Self-pay | Admitting: Pediatrics

## 2014-01-18 DIAGNOSIS — O479 False labor, unspecified: Secondary | ICD-10-CM | POA: Diagnosis not present

## 2014-01-18 MED ORDER — ZOLPIDEM TARTRATE 5 MG PO TABS
5.0000 mg | ORAL_TABLET | Freq: Once | ORAL | Status: AC
  Administered 2014-01-18: 5 mg via ORAL
  Filled 2014-01-18: qty 1

## 2014-01-18 NOTE — MAU Note (Signed)
Pt. States that she started feeling contractions earlier today and they have continued to get worse and closer together. She thinks they are now about 2 min apart.

## 2014-01-18 NOTE — Discharge Instructions (Signed)
Third Trimester of Pregnancy °The third trimester is from week 29 through week 42, months 7 through 9. The third trimester is a time when the fetus is growing rapidly. At the end of the ninth month, the fetus is about 20 inches in length and weighs 6-10 pounds.  °BODY CHANGES °Your body goes through many changes during pregnancy. The changes vary from woman to woman.  °· Your weight will continue to increase. You can expect to gain 25-35 pounds (11-16 kg) by the end of the pregnancy. °· You may begin to get stretch marks on your hips, abdomen, and breasts. °· You may urinate more often because the fetus is moving lower into your pelvis and pressing on your bladder. °· You may develop or continue to have heartburn as a result of your pregnancy. °· You may develop constipation because certain hormones are causing the muscles that push waste through your intestines to slow down. °· You may develop hemorrhoids or swollen, bulging veins (varicose veins). °· You may have pelvic pain because of the weight gain and pregnancy hormones relaxing your joints between the bones in your pelvis. Backaches may result from overexertion of the muscles supporting your posture. °· You may have changes in your hair. These can include thickening of your hair, rapid growth, and changes in texture. Some women also have hair loss during or after pregnancy, or hair that feels dry or thin. Your hair will most likely return to normal after your baby is born. °· Your breasts will continue to grow and be tender. A yellow discharge may leak from your breasts called colostrum. °· Your belly button may stick out. °· You may feel short of breath because of your expanding uterus. °· You may notice the fetus "dropping," or moving lower in your abdomen. °· You may have a bloody mucus discharge. This usually occurs a few days to a week before labor begins. °· Your cervix becomes thin and soft (effaced) near your due date. °WHAT TO EXPECT AT YOUR PRENATAL  EXAMS  °You will have prenatal exams every 2 weeks until week 36. Then, you will have weekly prenatal exams. During a routine prenatal visit: °· You will be weighed to make sure you and the fetus are growing normally. °· Your blood pressure is taken. °· Your abdomen will be measured to track your baby's growth. °· The fetal heartbeat will be listened to. °· Any test results from the previous visit will be discussed. °· You may have a cervical check near your due date to see if you have effaced. °At around 36 weeks, your caregiver will check your cervix. At the same time, your caregiver will also perform a test on the secretions of the vaginal tissue. This test is to determine if a type of bacteria, Group B streptococcus, is present. Your caregiver will explain this further. °Your caregiver may ask you: °· What your birth plan is. °· How you are feeling. °· If you are feeling the baby move. °· If you have had any abnormal symptoms, such as leaking fluid, bleeding, severe headaches, or abdominal cramping. °· If you have any questions. °Other tests or screenings that may be performed during your third trimester include: °· Blood tests that check for low iron levels (anemia). °· Fetal testing to check the health, activity level, and growth of the fetus. Testing is done if you have certain medical conditions or if there are problems during the pregnancy. °FALSE LABOR °You may feel small, irregular contractions that   eventually go away. These are called Braxton Hicks contractions, or false labor. Contractions may last for hours, days, or even weeks before true labor sets in. If contractions come at regular intervals, intensify, or become painful, it is best to be seen by your caregiver.  °SIGNS OF LABOR  °· Menstrual-like cramps. °· Contractions that are 5 minutes apart or less. °· Contractions that start on the top of the uterus and spread down to the lower abdomen and back. °· A sense of increased pelvic pressure or back  pain. °· A watery or bloody mucus discharge that comes from the vagina. °If you have any of these signs before the 37th week of pregnancy, call your caregiver right away. You need to go to the hospital to get checked immediately. °HOME CARE INSTRUCTIONS  °· Avoid all smoking, herbs, alcohol, and unprescribed drugs. These chemicals affect the formation and growth of the baby. °· Follow your caregiver's instructions regarding medicine use. There are medicines that are either safe or unsafe to take during pregnancy. °· Exercise only as directed by your caregiver. Experiencing uterine cramps is a good sign to stop exercising. °· Continue to eat regular, healthy meals. °· Wear a good support bra for breast tenderness. °· Do not use hot tubs, steam rooms, or saunas. °· Wear your seat belt at all times when driving. °· Avoid raw meat, uncooked cheese, cat litter boxes, and soil used by cats. These carry germs that can cause birth defects in the baby. °· Take your prenatal vitamins. °· Try taking a stool softener (if your caregiver approves) if you develop constipation. Eat more high-fiber foods, such as fresh vegetables or fruit and whole grains. Drink plenty of fluids to keep your urine clear or pale yellow. °· Take warm sitz baths to soothe any pain or discomfort caused by hemorrhoids. Use hemorrhoid cream if your caregiver approves. °· If you develop varicose veins, wear support hose. Elevate your feet for 15 minutes, 3-4 times a day. Limit salt in your diet. °· Avoid heavy lifting, wear low heal shoes, and practice good posture. °· Rest a lot with your legs elevated if you have leg cramps or low back pain. °· Visit your dentist if you have not gone during your pregnancy. Use a soft toothbrush to brush your teeth and be gentle when you floss. °· A sexual relationship may be continued unless your caregiver directs you otherwise. °· Do not travel far distances unless it is absolutely necessary and only with the approval  of your caregiver. °· Take prenatal classes to understand, practice, and ask questions about the labor and delivery. °· Make a trial run to the hospital. °· Pack your hospital bag. °· Prepare the baby's nursery. °· Continue to go to all your prenatal visits as directed by your caregiver. °SEEK MEDICAL CARE IF: °· You are unsure if you are in labor or if your water has broken. °· You have dizziness. °· You have mild pelvic cramps, pelvic pressure, or nagging pain in your abdominal area. °· You have persistent nausea, vomiting, or diarrhea. °· You have a bad smelling vaginal discharge. °· You have pain with urination. °SEEK IMMEDIATE MEDICAL CARE IF:  °· You have a fever. °· You are leaking fluid from your vagina. °· You have spotting or bleeding from your vagina. °· You have severe abdominal cramping or pain. °· You have rapid weight loss or gain. °· You have shortness of breath with chest pain. °· You notice sudden or extreme swelling   of your face, hands, ankles, feet, or legs. °· You have not felt your baby move in over an hour. °· You have severe headaches that do not go away with medicine. °· You have vision changes. °Document Released: 04/22/2001 Document Revised: 05/03/2013 Document Reviewed: 06/29/2012 °[employer]® Patient Information ©2015 [employer]. This information is not intended to replace advice given to you by your health care provider. Make sure you discuss any questions you have with your health care provider. °Fetal Movement Counts °Patient Name: __________________________________________________ Patient Due Date: ____________________ °Performing a fetal movement count is highly recommended in high-risk pregnancies, but it is good for every pregnant woman to do. Your health care provider may ask you to start counting fetal movements at 28 weeks of the pregnancy. Fetal movements often increase: °· After eating a full meal. °· After physical activity. °· After eating or drinking something sweet or  cold. °· At rest. °Pay attention to when you feel the baby is most active. This will help you notice a pattern of your baby's sleep and wake cycles and what factors contribute to an increase in fetal movement. It is important to perform a fetal movement count at the same time each day when your baby is normally most active.  °HOW TO COUNT FETAL MOVEMENTS °1. Find a quiet and comfortable area to sit or lie down on your left side. Lying on your left side provides the best blood and oxygen circulation to your baby. °2. Write down the day and time on a sheet of paper or in a journal. °3. Start counting kicks, flutters, swishes, rolls, or jabs in a 2-hour period. You should feel at least 10 movements within 2 hours. °4. If you do not feel 10 movements in 2 hours, wait 2-3 hours and count again. Look for a change in the pattern or not enough counts in 2 hours. °SEEK MEDICAL CARE IF: °· You feel less than 10 counts in 2 hours, tried twice. °· There is no movement in over an hour. °· The pattern is changing or taking longer each day to reach 10 counts in 2 hours. °· You feel the baby is not moving as he or she usually does. °Date: ____________ Movements: ____________ Start time: ____________ Finish time: ____________  °Date: ____________ Movements: ____________ Start time: ____________ Finish time: ____________ °Date: ____________ Movements: ____________ Start time: ____________ Finish time: ____________ °Date: ____________ Movements: ____________ Start time: ____________ Finish time: ____________ °Date: ____________ Movements: ____________ Start time: ____________ Finish time: ____________ °Date: ____________ Movements: ____________ Start time: ____________ Finish time: ____________ °Date: ____________ Movements: ____________ Start time: ____________ Finish time: ____________ °Date: ____________ Movements: ____________ Start time: ____________ Finish time: ____________  °Date: ____________ Movements: ____________ Start  time: ____________ Finish time: ____________ °Date: ____________ Movements: ____________ Start time: ____________ Finish time: ____________ °Date: ____________ Movements: ____________ Start time: ____________ Finish time: ____________ °Date: ____________ Movements: ____________ Start time: ____________ Finish time: ____________ °Date: ____________ Movements: ____________ Start time: ____________ Finish time: ____________ °Date: ____________ Movements: ____________ Start time: ____________ Finish time: ____________ °Date: ____________ Movements: ____________ Start time: ____________ Finish time: ____________  °Date: ____________ Movements: ____________ Start time: ____________ Finish time: ____________ °Date: ____________ Movements: ____________ Start time: ____________ Finish time: ____________ °Date: ____________ Movements: ____________ Start time: ____________ Finish time: ____________ °Date: ____________ Movements: ____________ Start time: ____________ Finish time: ____________ °Date: ____________ Movements: ____________ Start time: ____________ Finish time: ____________ °Date: ____________ Movements: ____________ Start time: ____________ Finish time: ____________ °Date: ____________ Movements: ____________ Start time: ____________ Finish time:   ____________  °Date: ____________ Movements: ____________ Start time: ____________ Finish time: ____________ °Date: ____________ Movements: ____________ Start time: ____________ Finish time: ____________ °Date: ____________ Movements: ____________ Start time: ____________ Finish time: ____________ °Date: ____________ Movements: ____________ Start time: ____________ Finish time: ____________ °Date: ____________ Movements: ____________ Start time: ____________ Finish time: ____________ °Date: ____________ Movements: ____________ Start time: ____________ Finish time: ____________ °Date: ____________ Movements: ____________ Start time: ____________ Finish time: ____________    °Date: ____________ Movements: ____________ Start time: ____________ Finish time: ____________ °Date: ____________ Movements: ____________ Start time: ____________ Finish time: ____________ °Date: ____________ Movements: ____________ Start time: ____________ Finish time: ____________ °Date: ____________ Movements: ____________ Start time: ____________ Finish time: ____________ °Date: ____________ Movements: ____________ Start time: ____________ Finish time: ____________ °Date: ____________ Movements: ____________ Start time: ____________ Finish time: ____________ °Date: ____________ Movements: ____________ Start time: ____________ Finish time: ____________  °Date: ____________ Movements: ____________ Start time: ____________ Finish time: ____________ °Date: ____________ Movements: ____________ Start time: ____________ Finish time: ____________ °Date: ____________ Movements: ____________ Start time: ____________ Finish time: ____________ °Date: ____________ Movements: ____________ Start time: ____________ Finish time: ____________ °Date: ____________ Movements: ____________ Start time: ____________ Finish time: ____________ °Date: ____________ Movements: ____________ Start time: ____________ Finish time: ____________ °Date: ____________ Movements: ____________ Start time: ____________ Finish time: ____________  °Date: ____________ Movements: ____________ Start time: ____________ Finish time: ____________ °Date: ____________ Movements: ____________ Start time: ____________ Finish time: ____________ °Date: ____________ Movements: ____________ Start time: ____________ Finish time: ____________ °Date: ____________ Movements: ____________ Start time: ____________ Finish time: ____________ °Date: ____________ Movements: ____________ Start time: ____________ Finish time: ____________ °Date: ____________ Movements: ____________ Start time: ____________ Finish time: ____________ °Date: ____________ Movements: ____________  Start time: ____________ Finish time: ____________  °Date: ____________ Movements: ____________ Start time: ____________ Finish time: ____________ °Date: ____________ Movements: ____________ Start time: ____________ Finish time: ____________ °Date: ____________ Movements: ____________ Start time: ____________ Finish time: ____________ °Date: ____________ Movements: ____________ Start time: ____________ Finish time: ____________ °Date: ____________ Movements: ____________ Start time: ____________ Finish time: ____________ °Date: ____________ Movements: ____________ Start time: ____________ Finish time: ____________ °Document Released: 05/28/2006 Document Revised: 09/12/2013 Document Reviewed: 02/23/2012 °[employer]® Patient Information ©2015 [employer]. This information is not intended to replace advice given to you by your health care provider. Make sure you discuss any questions you have with your health care provider. ° °

## 2014-01-19 ENCOUNTER — Inpatient Hospital Stay (HOSPITAL_COMMUNITY)
Admission: AD | Admit: 2014-01-19 | Discharge: 2014-01-19 | Disposition: A | Discharge status: Home / Self Care | Payer: Medicaid Other | Source: Ambulatory Visit | Attending: Obstetrics | Admitting: Obstetrics

## 2014-01-19 ENCOUNTER — Encounter (HOSPITAL_COMMUNITY): Payer: Self-pay | Admitting: *Deleted

## 2014-01-19 DIAGNOSIS — O479 False labor, unspecified: Secondary | ICD-10-CM | POA: Insufficient documentation

## 2014-01-19 NOTE — Progress Notes (Signed)
Pt. Verbalized understanding of Discharge instructions.

## 2014-01-19 NOTE — MAU Note (Signed)
Pt. Has been having uc's on and off for a few days now. Pt. States that the contractions got stronger and closer together tonight at 5pm and that she noticed a yellow discharge at 7 pm.

## 2014-01-24 ENCOUNTER — Telehealth (HOSPITAL_COMMUNITY): Payer: Self-pay | Admitting: *Deleted

## 2014-01-24 ENCOUNTER — Encounter (HOSPITAL_COMMUNITY): Payer: Self-pay | Admitting: *Deleted

## 2014-01-24 LAB — OB RESULTS CONSOLE GBS: GBS: NEGATIVE

## 2014-01-24 NOTE — Telephone Encounter (Signed)
Preadmission screen  

## 2014-01-25 ENCOUNTER — Inpatient Hospital Stay (HOSPITAL_COMMUNITY): Payer: Medicaid Other | Admitting: Anesthesiology

## 2014-01-25 ENCOUNTER — Inpatient Hospital Stay (HOSPITAL_COMMUNITY)
Admission: RE | Admit: 2014-01-25 | Discharge: 2014-01-27 | DRG: 775 | Disposition: A | Discharge status: Home / Self Care | Payer: Medicaid Other | Source: Ambulatory Visit | Attending: Obstetrics | Admitting: Obstetrics

## 2014-01-25 ENCOUNTER — Encounter (HOSPITAL_COMMUNITY): Payer: Self-pay

## 2014-01-25 ENCOUNTER — Encounter (HOSPITAL_COMMUNITY): Payer: Medicaid Other | Admitting: Anesthesiology

## 2014-01-25 DIAGNOSIS — O479 False labor, unspecified: Secondary | ICD-10-CM | POA: Diagnosis present

## 2014-01-25 DIAGNOSIS — Z3490 Encounter for supervision of normal pregnancy, unspecified, unspecified trimester: Secondary | ICD-10-CM

## 2014-01-25 LAB — CBC
HCT: 31.4 % — ABNORMAL LOW (ref 36.0–46.0)
Hemoglobin: 10.4 g/dL — ABNORMAL LOW (ref 12.0–15.0)
MCH: 28 pg (ref 26.0–34.0)
MCHC: 33.1 g/dL (ref 30.0–36.0)
MCV: 84.4 fL (ref 78.0–100.0)
PLATELETS: 196 10*3/uL (ref 150–400)
RBC: 3.72 MIL/uL — ABNORMAL LOW (ref 3.87–5.11)
RDW: 12.5 % (ref 11.5–15.5)
WBC: 7.5 10*3/uL (ref 4.0–10.5)

## 2014-01-25 LAB — RPR

## 2014-01-25 MED ORDER — FERROUS SULFATE 325 (65 FE) MG PO TABS
325.0000 mg | ORAL_TABLET | Freq: Two times a day (BID) | ORAL | Status: DC
  Administered 2014-01-26 – 2014-01-27 (×3): 325 mg via ORAL
  Filled 2014-01-25 (×3): qty 1

## 2014-01-25 MED ORDER — LACTATED RINGERS IV SOLN
INTRAVENOUS | Status: DC
  Administered 2014-01-25: 1000 mL via INTRAVENOUS

## 2014-01-25 MED ORDER — SIMETHICONE 80 MG PO CHEW
80.0000 mg | CHEWABLE_TABLET | ORAL | Status: DC | PRN
  Administered 2014-01-25 (×2): 80 mg via ORAL
  Filled 2014-01-25 (×4): qty 1

## 2014-01-25 MED ORDER — OXYCODONE-ACETAMINOPHEN 5-325 MG PO TABS
2.0000 | ORAL_TABLET | ORAL | Status: DC | PRN
  Administered 2014-01-26 – 2014-01-27 (×3): 2 via ORAL
  Filled 2014-01-25 (×4): qty 2

## 2014-01-25 MED ORDER — OXYTOCIN BOLUS FROM INFUSION: 500.0000 mL | INTRAVENOUS | Status: DC

## 2014-01-25 MED ORDER — OXYTOCIN 40 UNITS IN LACTATED RINGERS INFUSION - SIMPLE MED
1.0000 m[IU]/min | INTRAVENOUS | Status: DC
  Administered 2014-01-25: 6 m[IU]/min via INTRAVENOUS
  Administered 2014-01-25: 2.667 m[IU]/min via INTRAVENOUS
  Administered 2014-01-25: 2 m[IU]/min via INTRAVENOUS
  Filled 2014-01-25: qty 1000

## 2014-01-25 MED ORDER — OXYCODONE-ACETAMINOPHEN 5-325 MG PO TABS: 1.0000 | ORAL_TABLET | ORAL | Status: DC | PRN

## 2014-01-25 MED ORDER — OXYTOCIN 40 UNITS IN LACTATED RINGERS INFUSION - SIMPLE MED: 62.5000 mL/h | INTRAVENOUS | Status: DC

## 2014-01-25 MED ORDER — IBUPROFEN 600 MG PO TABS
600.0000 mg | ORAL_TABLET | Freq: Four times a day (QID) | ORAL | Status: DC
  Administered 2014-01-25 – 2014-01-27 (×6): 600 mg via ORAL
  Filled 2014-01-25 (×6): qty 1

## 2014-01-25 MED ORDER — PHENYLEPHRINE 40 MCG/ML (10ML) SYRINGE FOR IV PUSH (FOR BLOOD PRESSURE SUPPORT)
PREFILLED_SYRINGE | INTRAVENOUS | Status: AC
  Filled 2014-01-25: qty 10

## 2014-01-25 MED ORDER — ACETAMINOPHEN 325 MG PO TABS
650.0000 mg | ORAL_TABLET | ORAL | Status: DC | PRN
  Administered 2014-01-25 – 2014-01-26 (×2): 650 mg via ORAL
  Filled 2014-01-25 (×2): qty 2

## 2014-01-25 MED ORDER — TETANUS-DIPHTH-ACELL PERTUSSIS 5-2.5-18.5 LF-MCG/0.5 IM SUSP
0.5000 mL | Freq: Once | INTRAMUSCULAR | Status: AC
  Administered 2014-01-26: 0.5 mL via INTRAMUSCULAR
  Filled 2014-01-25: qty 0.5

## 2014-01-25 MED ORDER — FENTANYL 2.5 MCG/ML BUPIVACAINE 1/10 % EPIDURAL INFUSION (WH - ANES)
INTRAMUSCULAR | Status: DC | PRN
  Administered 2014-01-25: 14 mL/h via EPIDURAL

## 2014-01-25 MED ORDER — DIPHENHYDRAMINE HCL 25 MG PO CAPS: 25.0000 mg | ORAL_CAPSULE | Freq: Four times a day (QID) | ORAL | Status: DC | PRN

## 2014-01-25 MED ORDER — ONDANSETRON HCL 4 MG/2ML IJ SOLN: 4.0000 mg | Freq: Four times a day (QID) | INTRAMUSCULAR | Status: DC | PRN

## 2014-01-25 MED ORDER — LANOLIN HYDROUS EX OINT: TOPICAL_OINTMENT | CUTANEOUS | Status: DC | PRN

## 2014-01-25 MED ORDER — TERBUTALINE SULFATE 1 MG/ML IJ SOLN: 0.2500 mg | Freq: Once | INTRAMUSCULAR | Status: DC | PRN

## 2014-01-25 MED ORDER — ZOLPIDEM TARTRATE 5 MG PO TABS: 5.0000 mg | ORAL_TABLET | Freq: Every evening | ORAL | Status: DC | PRN

## 2014-01-25 MED ORDER — BENZOCAINE-MENTHOL 20-0.5 % EX AERO
1.0000 "application " | INHALATION_SPRAY | CUTANEOUS | Status: DC | PRN
  Administered 2014-01-25: 1 via TOPICAL
  Filled 2014-01-25: qty 56

## 2014-01-25 MED ORDER — PNEUMOCOCCAL VAC POLYVALENT 25 MCG/0.5ML IJ INJ
0.5000 mL | INJECTION | INTRAMUSCULAR | Status: AC
  Administered 2014-01-27: 0.5 mL via INTRAMUSCULAR
  Filled 2014-01-25: qty 0.5

## 2014-01-25 MED ORDER — FENTANYL 2.5 MCG/ML BUPIVACAINE 1/10 % EPIDURAL INFUSION (WH - ANES)
INTRAMUSCULAR | Status: AC
  Administered 2014-01-25: 11:00:00
  Filled 2014-01-25: qty 125

## 2014-01-25 MED ORDER — DIBUCAINE 1 % RE OINT: 1.0000 "application " | TOPICAL_OINTMENT | RECTAL | Status: DC | PRN

## 2014-01-25 MED ORDER — BUTORPHANOL TARTRATE 1 MG/ML IJ SOLN
1.0000 mg | INTRAMUSCULAR | Status: DC | PRN
  Administered 2014-01-25: 1 mg via INTRAVENOUS
  Filled 2014-01-25: qty 1

## 2014-01-25 MED ORDER — CITRIC ACID-SODIUM CITRATE 334-500 MG/5ML PO SOLN: 30.0000 mL | ORAL | Status: DC | PRN

## 2014-01-25 MED ORDER — OXYCODONE-ACETAMINOPHEN 5-325 MG PO TABS
2.0000 | ORAL_TABLET | ORAL | Status: DC | PRN
Start: 2014-01-25 — End: 2014-01-25

## 2014-01-25 MED ORDER — ONDANSETRON HCL 4 MG/2ML IJ SOLN: 4.0000 mg | INTRAMUSCULAR | Status: DC | PRN

## 2014-01-25 MED ORDER — LIDOCAINE HCL (PF) 1 % IJ SOLN
INTRAMUSCULAR | Status: DC | PRN
  Administered 2014-01-25 (×2): 8 mL

## 2014-01-25 MED ORDER — LACTATED RINGERS IV SOLN
500.0000 mL | INTRAVENOUS | Status: DC | PRN
  Administered 2014-01-25: 1000 mL via INTRAVENOUS

## 2014-01-25 MED ORDER — LIDOCAINE HCL (PF) 1 % IJ SOLN
30.0000 mL | INTRAMUSCULAR | Status: DC | PRN
  Administered 2014-01-25: 30 mL via SUBCUTANEOUS
  Filled 2014-01-25: qty 30

## 2014-01-25 MED ORDER — PRENATAL MULTIVITAMIN CH
1.0000 | ORAL_TABLET | Freq: Every day | ORAL | Status: DC
  Administered 2014-01-26: 1 via ORAL
  Filled 2014-01-25: qty 1

## 2014-01-25 MED ORDER — ONDANSETRON HCL 4 MG PO TABS: 4.0000 mg | ORAL_TABLET | ORAL | Status: DC | PRN

## 2014-01-25 MED ORDER — WITCH HAZEL-GLYCERIN EX PADS: 1.0000 "application " | MEDICATED_PAD | CUTANEOUS | Status: DC | PRN

## 2014-01-25 MED ORDER — SENNOSIDES-DOCUSATE SODIUM 8.6-50 MG PO TABS
2.0000 | ORAL_TABLET | ORAL | Status: DC
  Administered 2014-01-25 – 2014-01-27 (×2): 2 via ORAL
  Filled 2014-01-25 (×2): qty 2

## 2014-01-25 MED ORDER — FLEET ENEMA 7-19 GM/118ML RE ENEM: 1.0000 | ENEMA | RECTAL | Status: DC | PRN

## 2014-01-25 NOTE — Anesthesia Procedure Notes (Signed)
Epidural Patient location during procedure: OB Start time: 01/25/2014 11:21 AM End time: 01/25/2014 11:25 AM  Staffing Anesthesiologist: Leilani Able Performed by: anesthesiologist   Preanesthetic Checklist Completed: patient identified, surgical consent, pre-op evaluation, timeout performed, IV checked, risks and benefits discussed and monitors and equipment checked  Epidural Patient position: sitting Prep: site prepped and draped and DuraPrep Patient monitoring: continuous pulse ox and blood pressure Approach: midline Location: L3-L4 Injection technique: LOR air  Needle:  Needle type: Tuohy  Needle gauge: 17 G Needle length: 9 cm and 9 Needle insertion depth: 5 cm cm Catheter type: closed end flexible Catheter size: 19 Gauge Catheter at skin depth: 10 cm Test dose: negative and Other  Assessment Sensory level: T9 Events: blood not aspirated, injection not painful, no injection resistance, negative IV test and no paresthesia  Additional Notes Reason for block:procedure for pain

## 2014-01-25 NOTE — H&P (Signed)
This is Dr. Francoise Ceo dictating the history and physical on  Mariah Leonard she is a 18 year old gravida 1 at 82 weeks and 2 days [facility] 01/30/1951 negative GBS desires induction pregnancy has been complicated with premature contractions a for which she was hospitalized and she was on Procardia she is now having irregular contractions and she was started on Pitocin her cervix was 1 cm 80% vertex at -3 station Past medical history negative Past surgical history negative Social history negative System review negative Physical exam well-developed female in no distress HEENT negative Lungs clear to P&A Heart regular rhythm no murmurs no gallops as needed Abdomen term Extremities negative

## 2014-01-25 NOTE — Anesthesia Preprocedure Evaluation (Signed)

## 2014-01-25 NOTE — Progress Notes (Signed)
Patient ID: Mariah Leonard, female   DOB: 1995/05/27, 18 y.o.   MRN: [id number] Cervix 1 cm 90% vertex -2-3 amniotomy performed the fluids clear and she is on low-dose Pitocin

## 2014-01-26 LAB — CBC
HEMATOCRIT: 25.7 % — AB (ref 36.0–46.0)
HEMOGLOBIN: 8.7 g/dL — AB (ref 12.0–15.0)
MCH: 28.5 pg (ref 26.0–34.0)
MCHC: 33.9 g/dL (ref 30.0–36.0)
MCV: 84.3 fL (ref 78.0–100.0)
Platelets: 161 10*3/uL (ref 150–400)
RBC: 3.05 MIL/uL — ABNORMAL LOW (ref 3.87–5.11)
RDW: 12.5 % (ref 11.5–15.5)
WBC: 12.4 10*3/uL — AB (ref 4.0–10.5)

## 2014-01-26 NOTE — Progress Notes (Signed)
Patient ID: Mariah Leonard, female   DOB: Sep 17, 1995, 18 y.o.   MRN: [id number] Postpartum day one Vital signs normal Fundus firm Lochia moderate Doing well

## 2014-01-26 NOTE — Progress Notes (Signed)
UR completed 

## 2014-01-26 NOTE — Lactation Note (Signed)
This note was copied from the chart of Mariah Leonard. Lactation Consultation Note  Patient Name: Mariah Estephanie Hubbs [id number] Date: 01/26/2014 Reason for consult: Other (Comment) (charting for exclusion)   Maternal Data Formula Feeding for Exclusion: Yes Reason for exclusion: Mother's choice to formula feed on admision  Feeding Feeding Type: Bottle Fed - Formula  LATCH Score/Interventions                      Lactation Tools Discussed/Used     Consult Status Consult Status: Complete    Lynda Rainwater 01/26/2014, 3:48 PM

## 2014-01-26 NOTE — Anesthesia Postprocedure Evaluation (Signed)
Anesthesia Post Note  Patient: Mariah Leonard  Procedure(s) Performed: * No procedures listed *  Anesthesia type: Epidural  Patient location: Mother/Baby  Post pain: Pain level controlled  Post assessment: Post-op Vital signs reviewed  Last Vitals:  Filed Vitals:   01/26/14 0647  BP: 115/59  Pulse: 60  Temp: 36.8 C  Resp: 17    Post vital signs: Reviewed  Level of consciousness:alert  Complications: No apparent anesthesia complications

## 2014-01-27 NOTE — Discharge Instructions (Signed)
Discharge instructions   You can wash your hair  Shower  Eat what you want  Drink what you want  See me in 6 weeks  Your ankles are going to swell more in the next 2 weeks than when pregnant  No sex for 6 weeks   Orlin Kann A, MD 01/27/2014

## 2014-01-27 NOTE — Progress Notes (Signed)
Patient ID: Mariah Leonard, female   DOB: [birth year], 18 y.o.   MRN: [id number] Postpartum day 2 Vital signs normal Fundus firm Lochia moderate Doing well home today

## 2014-01-27 NOTE — Discharge Summary (Signed)
Obstetric Discharge Summary Reason for Admission: induction of labor Prenatal Procedures: none Intrapartum Procedures: spontaneous vaginal delivery Postpartum Procedures: none Complications-Operative and Postpartum: none Hemoglobin  Date Value Ref Range Status  01/26/2014 8.7* 12.0 - 15.0 g/dL Final     HCT  Date Value Ref Range Status  01/26/2014 25.7* 36.0 - 46.0 % Final    Physical Exam:  General: alert Lochia: appropriate Uterine Fundus: firm Incision: healing well DVT Evaluation: No evidence of DVT seen on physical exam.  Discharge Diagnoses: Term Pregnancy-delivered  Discharge Information: Date: 01/27/2014 Activity: pelvic rest Diet: routine Medications: Iron and Percocet Condition: improved Instructions: refer to practice specific booklet Discharge to: home Follow-up Information   Follow up with Kathreen Cosier, MD On 03/08/2014. (03/08/2014  @ 1:00 pm)    Specialty:  Obstetrics and Gynecology   Contact information:   [address] [place] [address] [phone]       Follow up with Kathreen Cosier, MD.   Specialty:  Obstetrics and Gynecology   Contact information:   [address] [place] [address] [phone]       Newborn Data: Live born female  Birth Weight: 7 lb 8 oz (3402 g) APGAR: 8,   Home with mother.  MARSHALL,BERNARD A 01/27/2014, 6:11 AM

## 2014-01-29 ENCOUNTER — Other Ambulatory Visit: Payer: Self-pay | Admitting: Obstetrics

## 2014-01-29 ENCOUNTER — Inpatient Hospital Stay (HOSPITAL_COMMUNITY)
Admission: AD | Admit: 2014-01-29 | Discharge: 2014-01-29 | Disposition: A | Discharge status: Home / Self Care | Payer: Medicaid Other | Source: Ambulatory Visit | Attending: Obstetrics | Admitting: Obstetrics

## 2014-01-29 ENCOUNTER — Encounter (HOSPITAL_COMMUNITY): Payer: Self-pay | Admitting: *Deleted

## 2014-01-29 DIAGNOSIS — O99893 Other specified diseases and conditions complicating puerperium: Secondary | ICD-10-CM | POA: Insufficient documentation

## 2014-01-29 DIAGNOSIS — R102 Pelvic and perineal pain: Secondary | ICD-10-CM

## 2014-01-29 DIAGNOSIS — N8189 Other female genital prolapse: Secondary | ICD-10-CM

## 2014-01-29 DIAGNOSIS — O9989 Other specified diseases and conditions complicating pregnancy, childbirth and the puerperium: Principal | ICD-10-CM

## 2014-01-29 DIAGNOSIS — N949 Unspecified condition associated with female genital organs and menstrual cycle: Secondary | ICD-10-CM | POA: Insufficient documentation

## 2014-01-29 LAB — URINALYSIS, ROUTINE W REFLEX MICROSCOPIC
BILIRUBIN URINE: NEGATIVE
GLUCOSE, UA: NEGATIVE mg/dL
Ketones, ur: NEGATIVE mg/dL
Nitrite: NEGATIVE
PH: 6.5 (ref 5.0–8.0)
Protein, ur: NEGATIVE mg/dL
SPECIFIC GRAVITY, URINE: 1.015 (ref 1.005–1.030)
Urobilinogen, UA: 0.2 mg/dL (ref 0.0–1.0)

## 2014-01-29 LAB — URINE MICROSCOPIC-ADD ON

## 2014-01-29 MED ORDER — POLYETHYLENE GLYCOL 3350 17 G PO PACK
17.0000 g | PACK | Freq: Once | ORAL | Status: AC
  Administered 2014-01-29: 17 g via ORAL
  Filled 2014-01-29: qty 1

## 2014-01-29 MED ORDER — DOCUSATE SODIUM 100 MG PO CAPS: 100.0000 mg | ORAL_CAPSULE | Freq: Two times a day (BID) | ORAL | Status: DC

## 2014-01-29 MED ORDER — POLYETHYLENE GLYCOL 3350 17 G PO PACK: 17.0000 g | PACK | Freq: Every day | ORAL | Status: DC

## 2014-01-29 MED ORDER — MORPHINE SULFATE 10 MG/ML IJ SOLN
10.0000 mg | Freq: Once | INTRAMUSCULAR | Status: AC
  Administered 2014-01-29: 10 mg via INTRAMUSCULAR
  Filled 2014-01-29: qty 1

## 2014-01-29 MED ORDER — MORPHINE SULFATE 10 MG/ML IJ SOLN: 10.0000 mg | Freq: Once | INTRAMUSCULAR | Status: DC

## 2014-01-29 MED ORDER — OXYCODONE-ACETAMINOPHEN 5-325 MG PO TABS: 1.0000 | ORAL_TABLET | ORAL | Status: DC | PRN

## 2014-01-29 MED ORDER — PROMETHAZINE HCL 25 MG/ML IJ SOLN
25.0000 mg | Freq: Once | INTRAMUSCULAR | Status: AC
  Administered 2014-01-29: 25 mg via INTRAMUSCULAR
  Filled 2014-01-29: qty 1

## 2014-01-29 NOTE — MAU Provider Note (Signed)
  History     CSN: [id number]  Arrival date and time: 01/29/14 1511   None     Chief Complaint  Patient presents with  . Vaginal Pain   HPI 18 y.o. G1P1001 with pain at suture line from 3rd degree repair after NSVD 5 days ago. Pt states pain is right at laceration, no relief w/ tylenol, motrin, dermaplast. Unable to have a BM for the past 5 days d/t severe pain w/ sitting on toilet/pushing.   Past Medical History  Diagnosis Date  . Infection     UTI    Past Surgical History  Procedure Laterality Date  . No past surgeries      History reviewed. No pertinent family history.  History  Substance Use Topics  . Smoking status: Never Smoker   . Smokeless tobacco: Never Used  . Alcohol Use: No    Allergies: No Known Allergies  No prescriptions prior to admission    Review of Systems  Constitutional: Negative.   Respiratory: Negative.   Cardiovascular: Negative.   Gastrointestinal: Positive for constipation. Negative for nausea, vomiting, abdominal pain and diarrhea.  Genitourinary: Negative for dysuria, urgency, frequency, hematuria and flank pain.       + pain  Musculoskeletal: Negative.   Neurological: Negative.   Psychiatric/Behavioral: Negative.    Physical Exam   Blood pressure 122/80, pulse 58, temperature 98.1 F (36.7 C), temperature source Oral, resp. rate 16, unknown if currently breastfeeding.  Physical Exam  Nursing note and vitals reviewed. Constitutional: She is oriented to person, place, and time. She appears well-developed and well-nourished. No distress.  Uncomfortable appearing, unable to sit comfortable, laying on side in bed  Cardiovascular: Normal rate.   Respiratory: Effort normal.  GI: Soft.  Genitourinary:    There is tenderness (at laceration repair, tenderness w/ light palpation, no significant swelling, induration, no hematoma) around the vagina. No erythema around the vagina.  Neurological: She is alert and oriented to person,  place, and time.  Skin: Skin is warm and dry.  Psychiatric: She has a normal mood and affect.    MAU Course  Procedures  Discussed w/ Dr. Clearance Coots, ordered Morphine 10 mg and Phenergan 25 mg IM for pain. Miralax for constipation. Pt had good relief of pain following meds.   Assessment and Plan   1. Perineal pain in female   2. Perineal laceration with delivery, third degree, postpartum condition   Rx Percocet, Miralax, d/c home w/ sitz baths, f/u in office for recheck    Medication List         docusate sodium 100 MG capsule  Commonly known as:  COLACE  Take 1 capsule (100 mg total) by mouth 2 (two) times daily.     oxyCODONE-acetaminophen 5-325 MG per tablet  Commonly known as:  PERCOCET/ROXICET  Take 1 tablet by mouth every 4 (four) hours as needed for severe pain.     polyethylene glycol packet  Commonly known as:  MIRALAX / GLYCOLAX  Take 17 g by mouth daily. Take twice daily until bowel movement, then daily afterwards.        Follow-up Information   Schedule an appointment as soon as possible for a visit with Kathreen Cosier, MD. (For wound re-check)    Specialty:  Obstetrics and Gynecology   Contact information:   [address] [place] [address] [phone]         [facility] 01/29/2014, 7:18 PM

## 2014-01-29 NOTE — MAU Note (Signed)
Given an ice pack to perineum for comfort.

## 2014-01-29 NOTE — MAU Note (Signed)
Pt presents to MAU with complaints of pain in her rectum from 3rd degree repair from vaginal delivery on September the 16th. Reports no bowel movement since before delivery.

## 2014-01-29 NOTE — Discharge Instructions (Signed)
Care of a Perineal Tear A perineal tear is a cut (laceration) in the tissue between the opening of the vagina and the anus (perineum). Some women naturally develop a perineal tear during a vaginal birth. This can happen as the baby emerges from the birth canal and the perineum is stretched. Perineal tears are graded based on how deep and long the laceration is. The grading for perineal tears is as follows:  First degree. This involves a shallow tear at the edge of the vaginal opening that extends slightly into the perineal skin.  Second degree. This involves tearing described in a first degree perineal tear and also a deeper tear of the vaginal opening and perineal tissues. It may also include tearing of a muscle just under the perineal skin.  Third degree. This involves tearing described in a first and second degree perineal tear, with the tear extending into the muscle of the anus (anal sphincter).  Fourth degree. This involves all levels of tear described for first, second, and third degree perineal tear, with the tear extending into the rectum. First degree perineal tears may or may not be stitched closed, depending on their location and appearance. Second, third, and fourth degree perineal tears are stitched closed immediately after the baby's birth. RISKS AND COMPLICATIONS Depending on the type of perineal tear you have, you may be at risk for the following:  Bleeding.  Developing a collection of blood in the perineal tear area (hematoma).  Pain. This may include pain with urination or bowel movements.  Infection at the site of the tear.  Fever.  Trouble controlling your bowels (fecal incontinence).  Painful sexual intercourse. HOW TO CARE FOR A PERINEAL TEAR  The first day, put ice on the area of the tear.  Put ice in a plastic bag.  Place a towel between your skin and the bag.  Leave the ice on for 20 minutes, 2-3 times a day.  Bathe using a warm sitz bath as directed by  your health care provider. This can speed up healing. Sitz baths can be performed in your bathtub or using a sitz bath kit that fits over your toilet.  Place 3-4 in. (7.6-10 cm) of warm water in your bathtub or fill the sitz bath over-the-toilet container with warm water. Make sure the water is not too hot by placing a drop on your wrist.  Sit in the warm water for 20-30 minutes.  After bathing, pat your perineum dry with a clean towel. Do not scrub the perineum as this could cause pain, irritation, or open any stitches you may have.  Keep the over-the-toilet sitz bath container clean by rinsing it thoroughly after each use. Ask for help in keeping the bathtub clean with diluted bleach and water (2 Tbsp [30 mL] of bleach to  gal [1.9 L] of water).  Repeat the sitz bath as often as you would like to relieve perineal pain, itching, or discomfort.  Apply a numbing spray to the perineal tear site as directed by your health care provider. This may help with discomfort.  Wash your hands before and after applying medicine to the area.  Put about 3 witch hazel-containing hemorrhoid treatment pads on top of your sanitary pad. The witch hazel in the hemorrhoid pads helps with discomfort and swelling.  Get a squeeze bottle to squeeze warm water on your perineum when urinating, spraying the area from front to back. Pat the area to dry it.  Sitting on an inflatable ring or pillow  may provide comfort.  Take medicines only as directed by your health care provider.  Do not have sexual intercourse or use tampons until your health care provider says it is okay. Typically, you must wait at least 6 weeks.  Keep all postpartum appointments as directed by your health care provider. SEEK MEDICAL CARE IF:   Your pain is not relieved with medicines.  You have painful urination.  You have a fever. SEEK IMMEDIATE MEDICAL CARE IF:   You have redness, swelling, or increasing pain in the area of the  tear.  You have pus coming from the area of the tear.  You notice a bad smell coming from the area of the tear.  Your tear opens.  You notice swelling in the area of the tear that is larger than when you left the hospital.  You cannot urinate. Document Released: 09/12/2013 Document Reviewed: 02/01/2013 [facility] Patient Information 2015 [place]. This information is not intended to replace advice given to you by your health care provider. Make sure you discuss any questions you have with your health care provider.

## 2014-02-04 ENCOUNTER — Encounter (HOSPITAL_COMMUNITY): Payer: Self-pay

## 2014-02-04 ENCOUNTER — Emergency Department (HOSPITAL_COMMUNITY)
Admission: EM | Admit: 2014-02-04 | Discharge: 2014-02-04 | Disposition: A | Discharge status: Home / Self Care | Payer: Medicaid Other | Attending: Emergency Medicine | Admitting: Emergency Medicine

## 2014-02-04 ENCOUNTER — Encounter (HOSPITAL_COMMUNITY): Payer: Self-pay | Admitting: Emergency Medicine

## 2014-02-04 ENCOUNTER — Inpatient Hospital Stay (EMERGENCY_DEPARTMENT_HOSPITAL)
Admission: AD | Admit: 2014-02-04 | Discharge: 2014-02-04 | Disposition: A | Discharge status: Home / Self Care | Payer: Medicaid Other | Source: Ambulatory Visit | Attending: Obstetrics | Admitting: Obstetrics

## 2014-02-04 ENCOUNTER — Emergency Department (HOSPITAL_COMMUNITY): Payer: Medicaid Other

## 2014-02-04 DIAGNOSIS — K5900 Constipation, unspecified: Secondary | ICD-10-CM

## 2014-02-04 DIAGNOSIS — Z87440 Personal history of urinary (tract) infections: Secondary | ICD-10-CM | POA: Insufficient documentation

## 2014-02-04 DIAGNOSIS — O9989 Other specified diseases and conditions complicating pregnancy, childbirth and the puerperium: Secondary | ICD-10-CM

## 2014-02-04 DIAGNOSIS — Z79899 Other long term (current) drug therapy: Secondary | ICD-10-CM | POA: Insufficient documentation

## 2014-02-04 DIAGNOSIS — O99893 Other specified diseases and conditions complicating puerperium: Secondary | ICD-10-CM | POA: Insufficient documentation

## 2014-02-04 MED ORDER — MAGNESIUM CITRATE PO SOLN: 1.0000 | Freq: Once | ORAL | Status: DC

## 2014-02-04 MED ORDER — MAGNESIUM CITRATE PO SOLN
1.0000 | Freq: Once | ORAL | Status: AC
  Administered 2014-02-04: 1 via ORAL
  Filled 2014-02-04: qty 296

## 2014-02-04 NOTE — MAU Provider Note (Signed)
  History     CSN: [id number]  Arrival date and time: 02/04/14 2057   First Provider Initiated Contact with Patient 02/04/14 2154      Chief Complaint  Patient presents with  . Constipation   Constipation Pertinent negatives include no abdominal pain, fever, nausea or vomiting.    Pt is a 18 yo G1P1001 here s/p NSVD on 01/25/14 with report of constipation.  Reports not having bowel movement since before having the baby.  Denies nausea or vomiting or abdominal cramping.  Pain is in rectal area.    Past Medical History  Diagnosis Date  . Infection     UTI    Past Surgical History  Procedure Laterality Date  . No past surgeries      History reviewed. No pertinent family history.  History  Substance Use Topics  . Smoking status: Never Smoker   . Smokeless tobacco: Never Used  . Alcohol Use: No    Allergies: No Known Allergies  Prescriptions prior to admission  Medication Sig Dispense Refill  . docusate sodium (COLACE) 100 MG capsule Take 1 capsule (100 mg total) by mouth 2 (two) times daily.  30 capsule  2  . magnesium citrate SOLN Take 296 mLs (1 Bottle total) by mouth once.  195 mL  0  . oxyCODONE-acetaminophen (PERCOCET/ROXICET) 5-325 MG per tablet Take 1 tablet by mouth every 4 (four) hours as needed for severe pain.  30 tablet  0  . polyethylene glycol (MIRALAX / GLYCOLAX) packet Take 17 g by mouth daily. Take twice daily until bowel movement, then daily afterwards.  30 each  3    Review of Systems  Constitutional: Negative for fever and chills.  Gastrointestinal: Positive for constipation. Negative for nausea, vomiting and abdominal pain.   Physical Exam   Blood pressure 119/78, pulse 87, temperature 98.4 F (36.9 C), temperature source Oral, resp. rate 18, SpO2 98.00%, unknown if currently breastfeeding.  Physical Exam  Constitutional: She is oriented to person, place, and time. She appears well-developed and well-nourished.  HENT:  Head: Normocephalic.   Neck: Normal range of motion. Neck supple.  Cardiovascular: Normal rate, regular rhythm and normal heart sounds.   Respiratory: Effort normal and breath sounds normal.  GI: Soft. Bowel sounds are normal. She exhibits distension.  Genitourinary: There is bleeding (scant) around the vagina. Vaginal discharge: vaginal bleeding.  Neurological: She is alert and oriented to person, place, and time. She has normal reflexes.  Skin: Skin is warm and dry.    MAU Course  Procedures  Soap suds enema > patient reports good relief  Assessment and Plan  Constipation  Plan: Discharge to home Continue the miralax Keep scheduled postpartum appointment  Rochele Pages N 02/04/2014, 9:55 PM

## 2014-02-04 NOTE — Discharge Instructions (Signed)

## 2014-02-04 NOTE — ED Notes (Signed)
Pt produced relief of constipation by manually expelling poop from her rectum.

## 2014-02-04 NOTE — ED Notes (Signed)
Patient arrives with complaint of constipation. States that this problem has been ongoing since giving birth. Last bowel movement reported as 01/23/2014. States use of percocets at home but only occasionally when needed for pain. Denies abdominal pain but states that her rectum hurts "very bad". States constant urge to have bowel movement. States she has sutures in the area of her rectum post birth and is afraid to push to hard for fear of rupture. Endorses use of stool softeners and laxatives at home without result.

## 2014-02-04 NOTE — ED Notes (Signed)
Pt attempting to have a bowel movement.

## 2014-02-04 NOTE — ED Provider Notes (Signed)
CSN: [id number]     Arrival date & time 02/04/14  7829 History   First MD Initiated Contact with Patient 02/04/14 0510     Chief Complaint  Patient presents with  . Constipation     (Consider location/radiation/quality/duration/timing/severity/associated sxs/prior Treatment) HPI Mariah Leonard is a 18 y.o. female with no significant past medical history coming in with constipation. Patient recently had a baby one week ago. This was uneventful and had no problems with pregnancy or delivery. Since then she has not had a single bowel movement. She [facility] She states it still has not helped. She denies any abdominal pain nausea vomiting fevers or recent infections. She denies any change in her urination or vaginal complaints. She has no chest pain or shortness of breath. Patient has no further complaints.  10 Systems reviewed and are negative for acute change except as noted in the HPI.     Past Medical History  Diagnosis Date  . Infection     UTI   Past Surgical History  Procedure Laterality Date  . No past surgeries     History reviewed. No pertinent family history. History  Substance Use Topics  . Smoking status: Never Smoker   . Smokeless tobacco: Never Used  . Alcohol Use: No   OB History   Grav Para Term Preterm Abortions TAB SAB Ect Mult Living   Review of Systems    Allergies  Review of patient's allergies indicates no known allergies.  Home Medications   Prior to Admission medications   Medication Sig Start Date End Date Taking? Authorizing Provider  docusate sodium (COLACE) 100 MG capsule Take 1 capsule (100 mg total) by mouth 2 (two) times daily. 01/29/14  Yes Archie Patten, CNM  oxyCODONE-acetaminophen (PERCOCET/ROXICET) 5-325 MG per tablet Take 1 tablet by mouth every 4 (four) hours as needed for severe pain. 01/29/14  Yes Archie Patten, CNM  polyethylene glycol (MIRALAX / GLYCOLAX)  packet Take 17 g by mouth daily. Take twice daily until bowel movement, then daily afterwards. 01/29/14  Yes Archie Patten, CNM  magnesium citrate SOLN Take 296 mLs (1 Bottle total) by mouth once. 02/04/14   Tomasita Crumble, MD   BP 118/70  Pulse 66  Temp(Src) 98.2 F (36.8 C) (Oral)  Resp 20  Ht  (1.676 m)  Wt 141 lb 3 oz (64.042 kg)  BMI 22.80 kg/m2  SpO2 100% Physical Exam  Nursing note and vitals reviewed. Constitutional: She is oriented to person, place, and time. She appears well-developed and well-nourished. No distress.  HENT:  Head: Normocephalic and atraumatic.  Nose: Nose normal.  Mouth/Throat: Oropharynx is clear and moist. No oropharyngeal exudate.  Eyes: Conjunctivae and EOM are normal. Pupils are equal, round, and reactive to light. No scleral icterus.  Neck: Normal range of motion. Neck supple. No JVD present. No tracheal deviation present. No thyromegaly present.  Cardiovascular: Normal rate, regular rhythm and normal heart sounds.  Exam reveals no gallop and no friction rub.   No murmur heard. Pulmonary/Chest: Effort normal and breath sounds normal. No respiratory distress. She has no wheezes. She exhibits no tenderness.  Abdominal: Soft. Bowel sounds are normal. She exhibits mass. She exhibits no distension. There is no tenderness. There is no rebound and no guarding.  Periumbilical mass palpated, suspicious for hernia. It is easily reducible.  Genitourinary:  Moderate amount of  stool in the rectal vault. One piece of hard stools palpated, but the patient did not tolerate a rectal exam. I was unable to disimpact her.  Musculoskeletal: Normal range of motion. She exhibits no edema and no tenderness.  Lymphadenopathy:    She has no cervical adenopathy.  Neurological: She is alert and oriented to person, place, and time.  Skin: Skin is warm and dry. No rash noted. She is not diaphoretic. No erythema. No pallor.    ED Course  Procedures (including critical care  time) Labs Review Labs Reviewed - No data to display  Imaging Review Dg Abd 2 Views  02/04/2014   CLINICAL DATA:  Postpartum and constipation.  EXAM: ABDOMEN - 2 VIEW  COMPARISON:  CT 10/18/2012  FINDINGS: Negative for free air on the upright view. There is an air-fluid level in the stomach. The lung bases are clear. Large amount of stool in the abdomen and pelvis, particularly in the rectal region. No acute bone abnormality. No large abdominal calcifications. Nonspecific bowel gas pattern.  IMPRESSION: Large stool burden in the abdomen and pelvis. Findings are compatible with constipation.   Electronically Signed   By: Richarda Overlie M.D.   On: 02/04/2014 07:26     EKG Interpretation None      MDM   Final diagnoses:  Constipation, unspecified constipation type    Patient seen emergency primary concern for constipation. Will obtain x-ray to ensure there is no obstruction.  She did not tolerate rectal exam due to extreme pain and tenderness.  I saw no external hemorrhoids or anal fissure.  Patient was given magnesium citrate in emergency department and attempted to disimpact herself. This resulted in a large bowel movement.  She'll be discharged with a prescription for one more dose. She is advised to wait 24 hours before taking it and see if she has another bowel movement on her own.  She was told to continue colace and miralax as prescribed and follow up with her regular doctor within 3 days for continued care.  She was told she may have a hernia felt on abdominal exam and to have this followed up as well.  Her vital signs remain within her normal limits and she is safe for discharge.   Tomasita Crumble, MD 02/04/14 1525

## 2014-02-04 NOTE — ED Notes (Signed)
Xray called and informed pt is ready for xray at this time.

## 2014-02-04 NOTE — MAU Note (Signed)
Vaginal delivery 01-25-14 .  Pt having constipation.  Reports today, only had hard stool when she stuck her fingers in rectum.  Miralax not working.  Came here earlier in week and went to [facility] yesterday.  Was given a suppository but didn't help much.

## 2014-02-04 NOTE — Discharge Instructions (Signed)
Constipation [name], you were seen today for constipation.  Your xray did not show any obstruction.  You may also have a hernia and need to follow up with your regular doctor within 3 days for continued care.  You were given magnesium citrate which should make you have a bowel movement.  You can take another dose tomorrow if needed, but wait and see if you have a bowel movement first.  If your symptoms worsen, or you develop fever, vomiting, abdominal pain, come back to the ED immediately for repeat evaluation. Thank you. Constipation is when a person:  Poops (has a bowel movement) less than 3 times a week.  Has a hard time pooping.  Has poop that is dry, hard, or bigger than normal. HOME CARE   Eat foods with a lot of fiber in them. This includes fruits, vegetables, beans, and whole grains such as brown rice.  Avoid fatty foods and foods with a lot of sugar. This includes french fries, hamburgers, cookies, candy, and soda.  If you are not getting enough fiber from food, take products with added fiber in them (supplements).  Drink enough fluid to keep your pee (urine) clear or pale yellow.  Exercise on a regular basis, or as told by your doctor.  Go to the restroom when you feel like you need to poop. Do not hold it.  Only take medicine as told by your doctor. Do not take medicines that help you poop (laxatives) without talking to your doctor first. GET HELP RIGHT AWAY IF:   You have bright red blood in your poop (stool).  Your constipation lasts more than 4 days or gets worse.  You have belly (abdominal) or butt (rectal) pain.  You have thin poop (as thin as a pencil).  You lose weight, and it cannot be explained. MAKE SURE YOU:   Understand these instructions.  Will watch your condition.  Will get help right away if you are not doing well or get worse. Document Released: 10/15/2007 Document Revised: 05/03/2013 Document Reviewed: 02/07/2013 [facility] Patient  Information 2015 [place], [place]. This information is not intended to replace advice given to you by your health care provider. Make sure you discuss any questions you have with your health care provider.

## 2014-03-13 ENCOUNTER — Encounter (HOSPITAL_COMMUNITY): Payer: Self-pay

## 2014-10-19 ENCOUNTER — Emergency Department (HOSPITAL_COMMUNITY): Payer: Medicaid Other

## 2014-10-19 ENCOUNTER — Emergency Department (HOSPITAL_COMMUNITY)
Admission: EM | Admit: 2014-10-19 | Discharge: 2014-10-19 | Disposition: A | Discharge status: Home / Self Care | Payer: Self-pay | Attending: Emergency Medicine | Admitting: Emergency Medicine

## 2014-10-19 ENCOUNTER — Encounter (HOSPITAL_COMMUNITY): Payer: Self-pay | Admitting: *Deleted

## 2014-10-19 DIAGNOSIS — R0789 Other chest pain: Secondary | ICD-10-CM | POA: Insufficient documentation

## 2014-10-19 DIAGNOSIS — Z79899 Other long term (current) drug therapy: Secondary | ICD-10-CM | POA: Insufficient documentation

## 2014-10-19 DIAGNOSIS — Z3202 Encounter for pregnancy test, result negative: Secondary | ICD-10-CM | POA: Insufficient documentation

## 2014-10-19 DIAGNOSIS — N390 Urinary tract infection, site not specified: Secondary | ICD-10-CM | POA: Insufficient documentation

## 2014-10-19 DIAGNOSIS — R079 Chest pain, unspecified: Secondary | ICD-10-CM

## 2014-10-19 LAB — URINALYSIS, ROUTINE W REFLEX MICROSCOPIC
Glucose, UA: NEGATIVE mg/dL
HGB URINE DIPSTICK: NEGATIVE
KETONES UR: NEGATIVE mg/dL
Nitrite: NEGATIVE
PROTEIN: NEGATIVE mg/dL
Specific Gravity, Urine: 1.031 — ABNORMAL HIGH (ref 1.005–1.030)
UROBILINOGEN UA: 1 mg/dL (ref 0.0–1.0)
pH: 5.5 (ref 5.0–8.0)

## 2014-10-19 LAB — URINE MICROSCOPIC-ADD ON

## 2014-10-19 LAB — CBC WITH DIFFERENTIAL/PLATELET
BASOS ABS: 0 10*3/uL (ref 0.0–0.1)
Basophils Relative: 0 % (ref 0–1)
EOS PCT: 4 % (ref 0–5)
Eosinophils Absolute: 0.3 10*3/uL (ref 0.0–0.7)
HCT: 35.8 % — ABNORMAL LOW (ref 36.0–46.0)
Hemoglobin: 11.8 g/dL — ABNORMAL LOW (ref 12.0–15.0)
Lymphocytes Relative: 30 % (ref 12–46)
Lymphs Abs: 2.1 10*3/uL (ref 0.7–4.0)
MCH: 26.9 pg (ref 26.0–34.0)
MCHC: 33 g/dL (ref 30.0–36.0)
MCV: 81.5 fL (ref 78.0–100.0)
MONOS PCT: 9 % (ref 3–12)
Monocytes Absolute: 0.7 10*3/uL (ref 0.1–1.0)
NEUTROS PCT: 57 % (ref 43–77)
Neutro Abs: 4 10*3/uL (ref 1.7–7.7)
PLATELETS: 304 10*3/uL (ref 150–400)
RBC: 4.39 MIL/uL (ref 3.87–5.11)
RDW: 12.9 % (ref 11.5–15.5)
WBC: 7 10*3/uL (ref 4.0–10.5)

## 2014-10-19 LAB — COMPREHENSIVE METABOLIC PANEL
ALBUMIN: 3.6 g/dL (ref 3.5–5.0)
ALT: 12 U/L — AB (ref 14–54)
AST: 18 U/L (ref 15–41)
Alkaline Phosphatase: 63 U/L (ref 38–126)
Anion gap: 7 (ref 5–15)
BUN: 11 mg/dL (ref 6–20)
CHLORIDE: 105 mmol/L (ref 101–111)
CO2: 26 mmol/L (ref 22–32)
Calcium: 9.2 mg/dL (ref 8.9–10.3)
Creatinine, Ser: 1.08 mg/dL — ABNORMAL HIGH (ref 0.44–1.00)
GFR calc Af Amer: >60 mL/min (ref >60)
Glucose, Bld: 99 mg/dL (ref 65–99)
POTASSIUM: 3.7 mmol/L (ref 3.5–5.1)
Sodium: 138 mmol/L (ref 135–145)
Total Bilirubin: 0.7 mg/dL (ref 0.3–1.2)
Total Protein: 7.4 g/dL (ref 6.5–8.1)

## 2014-10-19 LAB — POC URINE PREG, ED: PREG TEST UR: NEGATIVE

## 2014-10-19 LAB — LIPASE, BLOOD: Lipase: 12 U/L — ABNORMAL LOW (ref 22–51)

## 2014-10-19 MED ORDER — HYDROCODONE-ACETAMINOPHEN 5-325 MG PO TABS
1.0000 | ORAL_TABLET | Freq: Once | ORAL | Status: AC
  Administered 2014-10-19: 1 via ORAL
  Filled 2014-10-19: qty 1

## 2014-10-19 MED ORDER — NAPROXEN 500 MG PO TABS: 500.0000 mg | ORAL_TABLET | Freq: Two times a day (BID) | ORAL | Status: DC

## 2014-10-19 MED ORDER — CEPHALEXIN 500 MG PO CAPS: 500.0000 mg | ORAL_CAPSULE | Freq: Four times a day (QID) | ORAL | Status: DC

## 2014-10-19 MED ORDER — KETOROLAC TROMETHAMINE 30 MG/ML IJ SOLN
30.0000 mg | Freq: Once | INTRAMUSCULAR | Status: AC
  Administered 2014-10-19: 30 mg via INTRAVENOUS
  Filled 2014-10-19: qty 1

## 2014-10-19 NOTE — ED Notes (Signed)
MD Plunkett at bedside. 

## 2014-10-19 NOTE — ED Provider Notes (Addendum)
CSN: [id number]     Arrival date & time 10/19/14  1041 History   First MD Initiated Contact with Patient 10/19/14 1047     Chief Complaint  Patient presents with  . Flank Pain     (Consider location/radiation/quality/duration/timing/severity/associated sxs/prior Treatment) HPI Comments: Patient complains of a one-week history of sharp stabbing pain in her right side area. It is in the upper abdomen and lower chest area. It is worse with certain movements, coughing and taking deep breaths. She denies any fever, productive cough or nausea and vomiting. Pain is not affected by eating. Last menstrual period was 2 weeks ago and denies any urinary or vaginal complaints at this time. No prior history of similar symptoms. She denies any rashes.  She cannot recall any specific trauma. She has not taken any medication for this pain.  The history is provided by the patient.    Past Medical History  Diagnosis Date  . Infection     UTI   Past Surgical History  Procedure Laterality Date  . No past surgeries     History reviewed. No pertinent family history. History  Substance Use Topics  . Smoking status: Never Smoker   . Smokeless tobacco: Never Used  . Alcohol Use: No   OB History    Gravida Para Term Preterm AB TAB SAB Ectopic Multiple Living   1 1 1       1      Review of Systems  All other systems reviewed and are negative.     Allergies  Review of patient's allergies indicates no known allergies.  Home Medications   Prior to Admission medications   Medication Sig Start Date End Date Taking? Authorizing Provider  ibuprofen (ADVIL,MOTRIN) 200 MG tablet Take 200 mg by mouth every 6 (six) hours as needed for cramping.   Yes Historical Provider, MD  docusate sodium (COLACE) 100 MG capsule Take 1 capsule (100 mg total) by mouth 2 (two) times daily. Patient not taking: Reported on 10/19/2014 01/29/14   Archie Patten, CNM  magnesium citrate SOLN Take 296 mLs (1 Bottle total) by  mouth once. Patient not taking: Reported on 10/19/2014 02/04/14   Tomasita Crumble, MD  oxyCODONE-acetaminophen (PERCOCET/ROXICET) 5-325 MG per tablet Take 1 tablet by mouth every 4 (four) hours as needed for severe pain. Patient not taking: Reported on 10/19/2014 01/29/14   Archie Patten, CNM  polyethylene glycol [facility] / Ethelene Hal) packet Take 17 g by mouth daily. Take twice daily until bowel movement, then daily afterwards. Patient not taking: Reported on 10/19/2014 01/29/14   Archie Patten, CNM   BP 93/73 mmHg  Pulse 54  Temp(Src) 98 F (36.7 C) (Oral)  Resp 16  Ht 5\' 5"  (1.651 m)  Wt 130 lb (58.968 kg)  BMI 21.63 kg/m2  SpO2 97%  LMP 10/10/2014 Physical Exam  Constitutional: She is oriented to person, place, and time. She appears well-developed and well-nourished. No distress.  HENT:  Head: Normocephalic and atraumatic.  Mouth/Throat: Oropharynx is clear and moist.  Eyes: Conjunctivae and EOM are normal. Pupils are equal, round, and reactive to light.  Neck: Normal range of motion. Neck supple.  Cardiovascular: Normal rate, regular rhythm and intact distal pulses.   No murmur heard. Pulmonary/Chest: Effort normal and breath sounds normal. No respiratory distress. She has no wheezes. She has no rales.   She exhibits tenderness. She exhibits no deformity.  Abdominal: Soft. She exhibits no distension. There is tenderness in the right upper quadrant. There is  CVA tenderness. There is no rebound, no guarding and negative Murphy's sign.    Musculoskeletal: Normal range of motion. She exhibits no edema or tenderness.  Neurological: She is alert and oriented to person, place, and time.  Skin: Skin is warm and dry. No rash noted. No erythema.  Psychiatric: She has a normal mood and affect. Her behavior is normal.  Nursing note and vitals reviewed.   ED Course  Procedures (including critical care time) Labs Review Labs Reviewed  CBC WITH DIFFERENTIAL/PLATELET - Abnormal; Notable for  the following:    Hemoglobin 11.8 (*)    HCT 35.8 (*)    All other components within normal limits  COMPREHENSIVE METABOLIC PANEL - Abnormal; Notable for the following:    Creatinine, Ser 1.08 (*)    ALT 12 (*)    All other components within normal limits  LIPASE, BLOOD - Abnormal; Notable for the following:    Lipase 12 (*)    All other components within normal limits  URINALYSIS, ROUTINE W REFLEX MICROSCOPIC (NOT AT [facility]) - Abnormal; Notable for the following:    Color, Urine AMBER (*)    APPearance TURBID (*)    Specific Gravity, Urine 1.031 (*)    Bilirubin Urine SMALL (*)    Leukocytes, UA SMALL (*)    All other components within normal limits  URINE MICROSCOPIC-ADD ON - Abnormal; Notable for the following:    Bacteria, UA FEW (*)    All other components within normal limits  POC URINE PREG, ED    Imaging Review Dg Chest 2 View  10/19/2014   CLINICAL DATA:  Right lateral chest and rib pain for 1 week.  EXAM: CHEST  2 VIEW  COMPARISON:  10/28/2012  FINDINGS: Blunting of the lateral right costophrenic sulcus. No pleural fluid is seen in the lateral projection however. There is no edema, consolidation, effusion, or pneumothorax. Normal heart size and mediastinal contours. No osseous explanation for chest pain.  IMPRESSION: Trace pleural fluid on the right.Could the pain be pleurisy?   Electronically Signed   By: Marnee Spring M.D.   On: 10/19/2014 12:07     EKG Interpretation None      MDM   Final diagnoses:  Chest pain  Chest wall pain  UTI (lower urinary tract infection)    Patient presenting with right upper flank/chest pain that has been ongoing for approximately 1 week. She denies any urinary symptoms, nausea or vomiting. Pain is not affected by eating. It is worse with certain movements, deep breathing and coughing. She is negative satting 100% on room air with a normal heart rate and blood pressure. No risk factors for increased coagulation. No recent pregnancy and  postpartum 8 months.  Denies ever having similar pain.  On exam significant tenderness in the musculature of the right flank and upper abdomen. Low suspicion for gallbladder disease, hepatitis, appendicitis or diverticulitis. Low suspicion for a PE at this time.  Most likely muscular in nature.  CBC, CMP, lipase, UA and UPT chest x-ray pending.  1:56 PM Labs wnl except for developing UTI.  CXR with right trace pleural fluid which is where pt is hurting and most likely pleurisy.  Vicodin did not help with the pain.  Pt given toradol and will d/c home with naproxen and keflex.  2:45 PM Pain significantly relieved after toradol.    Gwyneth Sprout, MD 10/19/14 1445  Gwyneth Sprout, MD 10/19/14 1447

## 2014-10-19 NOTE — ED Notes (Signed)
Pt states pain in right flank x 1 week.  Pt states constant sharp stabbing pain 10/10.  Denies urinary issues.  Denies injury.

## 2014-10-19 NOTE — ED Notes (Signed)
md at bedside
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
## 2016-02-02 ENCOUNTER — Encounter: Payer: Self-pay | Admitting: *Deleted

## 2016-09-29 ENCOUNTER — Ambulatory Visit (HOSPITAL_COMMUNITY)
Admission: EM | Admit: 2016-09-29 | Discharge: 2016-09-29 | Disposition: A | Discharge status: Home / Self Care | Payer: Medicaid - Out of State | Attending: Family Medicine | Admitting: Family Medicine

## 2016-09-29 ENCOUNTER — Encounter (HOSPITAL_COMMUNITY): Payer: Self-pay | Admitting: Emergency Medicine

## 2016-09-29 DIAGNOSIS — M546 Pain in thoracic spine: Secondary | ICD-10-CM

## 2016-09-29 MED ORDER — NAPROXEN 500 MG PO TABS: 500.0000 mg | ORAL_TABLET | Freq: Two times a day (BID) | ORAL | 0 refills | Status: DC

## 2016-09-29 MED ORDER — CYCLOBENZAPRINE HCL 5 MG PO TABS: 5.0000 mg | ORAL_TABLET | Freq: Three times a day (TID) | ORAL | 0 refills | Status: DC | PRN
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
## 2016-12-06 ENCOUNTER — Emergency Department (HOSPITAL_COMMUNITY)
Admission: EM | Admit: 2016-12-06 | Discharge: 2016-12-06 | Disposition: A | Discharge status: Home / Self Care | Payer: No Typology Code available for payment source | Attending: Emergency Medicine | Admitting: Emergency Medicine

## 2016-12-06 ENCOUNTER — Emergency Department (HOSPITAL_COMMUNITY): Payer: No Typology Code available for payment source

## 2016-12-06 ENCOUNTER — Encounter (HOSPITAL_COMMUNITY): Payer: Self-pay | Admitting: Emergency Medicine

## 2016-12-06 DIAGNOSIS — Y92410 Unspecified street and highway as the place of occurrence of the external cause: Secondary | ICD-10-CM | POA: Diagnosis not present

## 2016-12-06 DIAGNOSIS — M791 Myalgia: Secondary | ICD-10-CM | POA: Diagnosis not present

## 2016-12-06 DIAGNOSIS — R0789 Other chest pain: Secondary | ICD-10-CM | POA: Insufficient documentation

## 2016-12-06 DIAGNOSIS — M549 Dorsalgia, unspecified: Secondary | ICD-10-CM | POA: Diagnosis not present

## 2016-12-06 DIAGNOSIS — Y999 Unspecified external cause status: Secondary | ICD-10-CM | POA: Diagnosis not present

## 2016-12-06 DIAGNOSIS — Y939 Activity, unspecified: Secondary | ICD-10-CM | POA: Diagnosis not present

## 2016-12-06 LAB — POC URINE PREG, ED: PREG TEST UR: NEGATIVE

## 2016-12-06 MED ORDER — ACETAMINOPHEN 500 MG PO TABS
1000.0000 mg | ORAL_TABLET | Freq: Once | ORAL | Status: AC
  Administered 2016-12-06: 1000 mg via ORAL
  Filled 2016-12-06: qty 2

## 2016-12-06 MED ORDER — IBUPROFEN 400 MG PO TABS
400.0000 mg | ORAL_TABLET | Freq: Once | ORAL | Status: AC
  Administered 2016-12-06: 400 mg via ORAL
  Filled 2016-12-06: qty 1
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
## 2016-12-06 NOTE — ED Notes (Signed)
Patient transported to X-ray 
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
## 2016-12-09 ENCOUNTER — Emergency Department (HOSPITAL_COMMUNITY)
Admission: EM | Admit: 2016-12-09 | Discharge: 2016-12-09 | Disposition: A | Discharge status: Home / Self Care | Payer: No Typology Code available for payment source | Attending: Emergency Medicine | Admitting: Emergency Medicine

## 2016-12-09 DIAGNOSIS — Y939 Activity, unspecified: Secondary | ICD-10-CM | POA: Diagnosis not present

## 2016-12-09 DIAGNOSIS — Y999 Unspecified external cause status: Secondary | ICD-10-CM | POA: Diagnosis not present

## 2016-12-09 DIAGNOSIS — S39012A Strain of muscle, fascia and tendon of lower back, initial encounter: Secondary | ICD-10-CM | POA: Diagnosis not present

## 2016-12-09 DIAGNOSIS — S3992XA Unspecified injury of lower back, initial encounter: Secondary | ICD-10-CM | POA: Diagnosis present

## 2016-12-09 DIAGNOSIS — Y929 Unspecified place or not applicable: Secondary | ICD-10-CM | POA: Diagnosis not present

## 2016-12-09 MED ORDER — CYCLOBENZAPRINE HCL 5 MG PO TABS: 5.0000 mg | ORAL_TABLET | Freq: Three times a day (TID) | ORAL | 0 refills | Status: DC | PRN

## 2016-12-09 MED ORDER — DICLOFENAC SODIUM 50 MG PO TBEC: 50.0000 mg | DELAYED_RELEASE_TABLET | Freq: Two times a day (BID) | ORAL | 0 refills | Status: DC
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
## 2016-12-09 NOTE — Discharge Instructions (Signed)
The muscle relaxant can make you sleepy.

## 2016-12-09 NOTE — ED Notes (Signed)
Patient is A&Ox4.  No signs of distress noted.  Please see providers complete history and physical exam.  

## 2017-01-02 ENCOUNTER — Encounter (HOSPITAL_COMMUNITY): Payer: Self-pay | Admitting: Emergency Medicine

## 2017-01-02 ENCOUNTER — Emergency Department (HOSPITAL_COMMUNITY)
Admission: EM | Admit: 2017-01-02 | Discharge: 2017-01-02 | Discharge status: Against Medical Advice | Payer: No Typology Code available for payment source | Attending: Emergency Medicine | Admitting: Emergency Medicine

## 2017-01-02 DIAGNOSIS — Z043 Encounter for examination and observation following other accident: Secondary | ICD-10-CM | POA: Diagnosis not present

## 2017-01-02 DIAGNOSIS — Z5321 Procedure and treatment not carried out due to patient leaving prior to being seen by health care provider: Secondary | ICD-10-CM | POA: Insufficient documentation

## 2017-01-02 MED ORDER — ONDANSETRON 4 MG PO TBDP
4.0000 mg | ORAL_TABLET | Freq: Once | ORAL | Status: AC
  Administered 2017-01-02: 4 mg via ORAL

## 2017-01-02 MED ORDER — IBUPROFEN 400 MG PO TABS
400.0000 mg | ORAL_TABLET | Freq: Once | ORAL | Status: AC
  Administered 2017-01-02: 400 mg via ORAL

## 2017-01-02 MED ORDER — ONDANSETRON 4 MG PO TBDP
ORAL_TABLET | ORAL | Status: AC
  Filled 2017-01-02: qty 1

## 2017-01-02 MED ORDER — IBUPROFEN 400 MG PO TABS
ORAL_TABLET | ORAL | Status: AC
  Filled 2017-01-02: qty 1
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
## 2017-01-06 ENCOUNTER — Emergency Department
Admission: EM | Admit: 2017-01-06 | Discharge: 2017-01-06 | Disposition: A | Discharge status: Home / Self Care | Payer: No Typology Code available for payment source | Attending: Emergency Medicine | Admitting: Emergency Medicine

## 2017-01-06 ENCOUNTER — Emergency Department: Payer: No Typology Code available for payment source

## 2017-01-06 ENCOUNTER — Encounter: Payer: Self-pay | Admitting: Emergency Medicine

## 2017-01-06 DIAGNOSIS — S0990XA Unspecified injury of head, initial encounter: Secondary | ICD-10-CM | POA: Insufficient documentation

## 2017-01-06 DIAGNOSIS — Y999 Unspecified external cause status: Secondary | ICD-10-CM | POA: Insufficient documentation

## 2017-01-06 DIAGNOSIS — Y92410 Unspecified street and highway as the place of occurrence of the external cause: Secondary | ICD-10-CM | POA: Insufficient documentation

## 2017-01-06 DIAGNOSIS — F0781 Postconcussional syndrome: Secondary | ICD-10-CM | POA: Diagnosis not present

## 2017-01-06 DIAGNOSIS — Z79899 Other long term (current) drug therapy: Secondary | ICD-10-CM | POA: Insufficient documentation

## 2017-01-06 DIAGNOSIS — Y939 Activity, unspecified: Secondary | ICD-10-CM | POA: Insufficient documentation
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
## 2017-01-06 NOTE — ED Notes (Signed)
Pt alert and oriented X4, active, cooperative, pt in NAD. RR even and unlabored, color WNL.  Pt did not want to stay for repeat VS. Pt states she and her daughter who is also a patient are ready to go.
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
## 2017-05-31 ENCOUNTER — Emergency Department
Admission: EM | Admit: 2017-05-31 | Discharge: 2017-05-31 | Disposition: A | Discharge status: Home / Self Care | Payer: Self-pay | Attending: Emergency Medicine | Admitting: Emergency Medicine

## 2017-05-31 ENCOUNTER — Encounter: Payer: Self-pay | Admitting: *Deleted

## 2017-05-31 ENCOUNTER — Other Ambulatory Visit: Payer: Self-pay

## 2017-05-31 DIAGNOSIS — L02415 Cutaneous abscess of right lower limb: Secondary | ICD-10-CM | POA: Insufficient documentation

## 2017-05-31 MED ORDER — HYDROCODONE-ACETAMINOPHEN 5-325 MG PO TABS: 1.0000 | ORAL_TABLET | Freq: Four times a day (QID) | ORAL | 0 refills | Status: DC | PRN

## 2017-05-31 MED ORDER — SULFAMETHOXAZOLE-TRIMETHOPRIM 800-160 MG PO TABS: 1.0000 | ORAL_TABLET | Freq: Two times a day (BID) | ORAL | 0 refills | Status: DC

## 2017-05-31 MED ORDER — LIDOCAINE HCL (PF) 1 % IJ SOLN
5.0000 mL | Freq: Once | INTRAMUSCULAR | Status: AC
  Administered 2017-05-31: 5 mL
  Filled 2017-05-31: qty 5

## 2017-05-31 MED ORDER — ACETAMINOPHEN 500 MG PO TABS
1000.0000 mg | ORAL_TABLET | Freq: Once | ORAL | Status: AC
  Administered 2017-05-31: 1000 mg via ORAL

## 2017-05-31 MED ORDER — ACETAMINOPHEN 500 MG PO TABS
ORAL_TABLET | ORAL | Status: AC
  Filled 2017-05-31: qty 2

## 2017-05-31 NOTE — ED Triage Notes (Signed)
Pt report shaving an abscess on the right inner thigh near groin area that has been growing in size for the past 2 days. No fevers reported. No drainage reported.

## 2017-05-31 NOTE — ED Notes (Signed)
Dry dressing applied to site per this RN

## 2017-05-31 NOTE — ED Triage Notes (Signed)
First Nurse Note:  C/O boil to leg x days.

## 2017-05-31 NOTE — ED Provider Notes (Signed)
[facility] Emergency Department Provider Note  ____________________________________________   First MD Initiated Contact with Patient 05/31/17 1133     (approximate)  I have reviewed the triage vital signs and the nursing notes.   HISTORY  Chief Complaint Abscess   HPI Mariah Leonard is a 22 y.o. female is here with complaint of abscess to her right inner thigh for the last 2 days.  Patient states that pain has increased but she denies fever or chills.  There is been no drainage from the area.  She denies any previous abscesses.  Patient has not taken any over-the-counter medication for her pain.  She rates it as a 10/10.  Past Medical History:  Diagnosis Date  . Infection    UTI    Patient Active Problem List   Diagnosis Date Noted  . Pregnant 01/25/2014  . NVD (normal vaginal delivery) 01/25/2014  . Premature uterine contractions 12/26/2013  . Preterm contractions 12/25/2013  . VOMITING 12/19/2008    Past Surgical History:  Procedure Laterality Date  . NO PAST SURGERIES      Prior to Admission medications   Medication Sig Start Date End Date Taking? Authorizing Provider  HYDROcodone-acetaminophen (NORCO/VICODIN) 5-325 MG tablet Take 1 tablet by mouth every 6 (six) hours as needed for moderate pain. 05/31/17   Tommi Rumps, PA-C  sulfamethoxazole-trimethoprim (BACTRIM DS,SEPTRA DS) 800-160 MG tablet Take 1 tablet by mouth 2 (two) times daily. 05/31/17   Tommi Rumps, PA-C    Allergies Patient has no known allergies.  History reviewed. No pertinent family history.  Social History Social History   Tobacco Use  . Smoking status: Never Smoker  . Smokeless tobacco: Never Used  Substance Use Topics  . Alcohol use: No  . Drug use: No    Review of Systems Constitutional: No fever/chills Cardiovascular: Denies chest pain. Respiratory: Denies shortness of breath. Musculoskeletal: Negative for back pain. Skin: Positive for  abscess. Neurological: Negative for headaches. ____________________________________________   PHYSICAL EXAM:  VITAL SIGNS: ED Triage Vitals  Enc Vitals Group     BP 05/31/17 1119 108/64     Pulse Rate 05/31/17 1119 96     Resp 05/31/17 1119 15     Temp 05/31/17 1119 98.7 F (37.1 C)     Temp Source 05/31/17 1119 Oral     SpO2 05/31/17 1119 99 %     Weight 05/31/17 1120 129 lb (58.5 kg)     Height 05/31/17 1120 5\' 5"  (1.651 m)     Head Circumference --      Peak Flow --      Pain Score 05/31/17 1121 10     Pain Loc --      Pain Edu? --      Excl. in GC? --    Constitutional: Alert and oriented. Well appearing and in no acute distress. Eyes: Conjunctivae are normal.  Head: Atraumatic. Neck: No stridor.   Cardiovascular: Normal rate, regular rhythm. Grossly normal heart sounds.  Good peripheral circulation. Respiratory: Normal respiratory effort.  No retractions. Lungs CTAB. Musculoskeletal: Moves upper and lower extremities without any difficulty.  Normal gait was noted. Neurologic:  Normal speech and language. No gross focal neurologic deficits are appreciated. No gait instability. Skin:  Skin is warm, dry and intact.  There is a 2-1/2 cm elongated abscess on the upper inner thigh without erythema.  Area is fluctuant and markedly tender to palpation. Psychiatric: Mood and affect are normal. Speech and behavior are normal.  ____________________________________________   LABS (all labs ordered are listed, but only abnormal results are displayed)  Labs Reviewed - No data to display  PROCEDURES  Procedure(s) performed: INCISION AND DRAINAGE Performed by: Tommi Rumpshonda L Gustavia Carie Consent: Verbal consent obtained. Risks and benefits: risks, benefits and alternatives were discussed Type: abscess  Body area: Right upper inner thigh.  Anesthesia: local infiltration  Incision was made with a scalpel.  Local anesthetic: lidocaine 1 % without epinephrine  Anesthetic total: 2.5  ml  Complexity: complex Blunt dissection to break up loculations  Drainage: purulent  Drainage amount: Moderate  Packing material: 1/4 in iodoform gauze  Patient tolerance: Patient tolerated the procedure well with no immediate complications.    Procedures  Critical Care performed: No  ____________________________________________   INITIAL IMPRESSION / ASSESSMENT AND PLAN / ED COURSE Patient tolerated procedure well.  She was given Bactrim DS twice daily for 10 days and Norco 1 every 6 hours as needed for pain.  She is to return to the emergency department or see her PCP for packing removal in 2 days if it is not already fallen out.  She is instructed to use warm compresses frequently to this area and take all of the antibiotics.   ____________________________________________   FINAL CLINICAL IMPRESSION(S) / ED DIAGNOSES  Final diagnoses:  Abscess of right thigh     ED Discharge Orders        Ordered    sulfamethoxazole-trimethoprim (BACTRIM DS,SEPTRA DS) 800-160 MG tablet  2 times daily     05/31/17 1308    HYDROcodone-acetaminophen (NORCO/VICODIN) 5-325 MG tablet  Every 6 hours PRN     05/31/17 1308       Note:  This document was prepared using Dragon voice recognition software and may include unintentional dictation errors.    Tommi RumpsSummers, Cheryle Dark L, PA-C 05/31/17 1559    Governor RooksLord, Rebecca, MD 06/01/17 1007

## 2017-05-31 NOTE — Discharge Instructions (Signed)
Begin taking antibiotics twice a day for the next 10 days.  Norco as needed for pain.  Do not drive and take the pain medication as it could cause drowsiness.  [facility] for drain removal.  If this drain falls out on its own you will not need to return unless you are having problems.  After the drain has been removed begin using warm compresses to the area frequently.

## 2017-06-04 ENCOUNTER — Emergency Department
Admission: EM | Admit: 2017-06-04 | Discharge: 2017-06-04 | Disposition: A | Discharge status: Home / Self Care | Payer: Medicaid Other | Attending: Emergency Medicine | Admitting: Emergency Medicine

## 2017-06-04 ENCOUNTER — Encounter: Payer: Self-pay | Admitting: Intensive Care

## 2017-06-04 DIAGNOSIS — L0291 Cutaneous abscess, unspecified: Secondary | ICD-10-CM

## 2017-06-04 DIAGNOSIS — L02415 Cutaneous abscess of right lower limb: Secondary | ICD-10-CM | POA: Insufficient documentation

## 2017-06-04 MED ORDER — HYDROCODONE-ACETAMINOPHEN 5-325 MG PO TABS: 1.0000 | ORAL_TABLET | Freq: Four times a day (QID) | ORAL | 0 refills | Status: DC | PRN

## 2017-06-04 NOTE — ED Provider Notes (Signed)
[facility] Emergency Department Provider Note  ____________________________________________   First MD Initiated Contact with Patient 06/04/17 1245     (approximate)  I have reviewed the triage vital signs and the nursing notes.   HISTORY  Chief Complaint Abscess    HPI Mariah Leonard is a 22 y.o. female complains of continued pain in the right upper thigh area where she had an abscess opened the other day.  She states it still feels like she has the boil.  She denies fever.  She states she has had some chills.  She is taking the antibiotic as prescribed  Past Medical History:  Diagnosis Date  . Infection    UTI    Patient Active Problem List   Diagnosis Date Noted  . Pregnant 01/25/2014  . NVD (normal vaginal delivery) 01/25/2014  . Premature uterine contractions 12/26/2013  . Preterm contractions 12/25/2013  . VOMITING 12/19/2008    Past Surgical History:  Procedure Laterality Date  . NO PAST SURGERIES      Prior to Admission medications   Medication Sig Start Date End Date Taking? Authorizing Provider  HYDROcodone-acetaminophen (NORCO/VICODIN) 5-325 MG tablet Take 1 tablet by mouth every 6 (six) hours as needed for moderate pain. 06/04/17   Fisher, Roselyn Bering, PA-C  sulfamethoxazole-trimethoprim (BACTRIM DS,SEPTRA DS) 800-160 MG tablet Take 1 tablet by mouth 2 (two) times daily. 05/31/17   Tommi Rumps, PA-C    Allergies Patient has no known allergies.  History reviewed. No pertinent family history.  Social History Social History   Tobacco Use  . Smoking status: Never Smoker  . Smokeless tobacco: Never Used  Substance Use Topics  . Alcohol use: No  . Drug use: No    Review of Systems  Constitutional: No fever/chills Eyes: No visual changes. ENT: No sore throat. Respiratory: Denies cough Genitourinary: Negative for dysuria. Musculoskeletal: Negative for back pain. Skin: Positive for  abscess    ____________________________________________   PHYSICAL EXAM:  VITAL SIGNS: ED Triage Vitals  Enc Vitals Group     BP 06/04/17 1241 117/60     Pulse Rate 06/04/17 1241 (!) 102     Resp 06/04/17 1241 18     Temp 06/04/17 1241 98 F (36.7 C)     Temp Source 06/04/17 1241 Oral     SpO2 06/04/17 1241 100 %     Weight 06/04/17 1241 125 lb (56.7 kg)     Height 06/04/17 1241 5\' 5"  (1.651 m)     Head Circumference --      Peak Flow --      Pain Score 06/04/17 1248 8     Pain Loc --      Pain Edu? --      Excl. in GC? --     Constitutional: Alert and oriented. Well appearing and in no acute distress. Eyes: Conjunctivae are normal.  Head: Atraumatic. Nose: No congestion/rhinnorhea. Mouth/Throat: Mucous membranes are moist.   Cardiovascular: Normal rate, regular rhythm. Respiratory: Normal respiratory effort.  No retractions GU: deferred Musculoskeletal: FROM all extremities, warm and well perfused Neurologic:  Normal speech and language.  Skin:  Skin is warm, dry.  Is packing in the recently I&D the abscess.  The packing was removed.  There is no drainage or bleeding from the area.  There is still some induration around the incision.   Psychiatric: Mood and affect are normal. Speech and behavior are normal.  ____________________________________________   LABS (all labs ordered are listed, but only  abnormal results are displayed)  Labs Reviewed - No data to display ____________________________________________   ____________________________________________  RADIOLOGY    ____________________________________________   PROCEDURES  Procedure(s) performed: No  Procedures    ____________________________________________   INITIAL IMPRESSION / ASSESSMENT AND PLAN / ED COURSE  Pertinent labs & imaging results that were available during my care of the patient were reviewed by me and considered in my medical decision making (see chart for  details).  Patient is a 22 year old female that is here for recheck of an abscessed.  She had the area I and D'd 4 days ago.  She denies fever chills.  She complains of continued pain  On physical exam the area appears to be healing well.  Packing was removed.  There is no drainage or bleeding at this time.  Patient was instructed to soak in a tub of warm water with Epsom salts to help soften the area.  She is to keep the areas clean and dry as possible.  He is to follow-up with surgeon if she continues to have problems.  She was given phone number.  She states she understands and will follow up as needed.  She was given an additional 8 pills of Norco.  Patient understands that she could develop an addiction to these if she continues to take this medication.  She was discharged in stable condition     As part of my medical decision making, I reviewed the following data within the electronic MEDICAL RECORD NUMBER Nursing notes reviewed and incorporated, Old chart reviewed, Notes from prior ED visits and  Controlled Substance Database  ____________________________________________   FINAL CLINICAL IMPRESSION(S) / ED DIAGNOSES  Final diagnoses:  Abscess      NEW MEDICATIONS STARTED DURING THIS VISIT:  New Prescriptions   HYDROCODONE-ACETAMINOPHEN (NORCO/VICODIN) 5-325 MG TABLET    Take 1 tablet by mouth every 6 (six) hours as needed for moderate pain.     Note:  This document was prepared using Dragon voice recognition software and may include unintentional dictation errors.    Faythe GheeFisher, Susan W, PA-C 06/04/17 1305    Sharyn CreamerQuale, Mark, MD 06/04/17 [phone]

## 2017-06-04 NOTE — ED Triage Notes (Signed)
Pt reports had abscess drained a few days ago and was advised to come back for a recheck. Pt reports that the area hurts worse.

## 2017-06-04 NOTE — Discharge Instructions (Signed)
Soak in warm water with Epsom salts.  Keep the areas clean and dry as possible.  Apply bandage if you are at work.  If you are at home let the air get to the area to help heal.  If you develop fever chills please return to the emergency department.  If you are worsening please return to the emergency department.  If the area recur she should see a Careers advisersurgeon.  Their phone number has been provided

## 2017-06-04 NOTE — ED Notes (Signed)
Sterile guaze applied to wound

## 2017-06-04 NOTE — ED Triage Notes (Signed)
Patient states "I had a boil a couple of days ago and they drained it and it is still painful" Boil has guaze stuffed in where it was drained. Patient told to come back in the 2nd day and today is the 4th day.

## 2017-06-09 ENCOUNTER — Telehealth: Payer: Self-pay | Admitting: General Practice

## 2017-06-09 NOTE — Telephone Encounter (Signed)
-----   Message from Nicole KindredAngela K Brouillard sent at 06/09/2017  1:17 PM EST ----- Regarding: ED referral-new patient Patient seen in ED on 1/20 and 1/24 for abscess to her right inner thigh. She had an I&D done by the ED doctor on 1/20. Packing has been removed. Please call patient to see if she would like to have a follow up with one of our surgeon to check for wound healing. She can see any provider. Will be a new patient.

## 2017-06-09 NOTE — Telephone Encounter (Signed)
Unable to leave a voicemail for the patient to call the office due to the number was incorrect.

## 2017-06-16 ENCOUNTER — Encounter: Payer: Self-pay | Admitting: General Practice

## 2017-06-16 NOTE — Telephone Encounter (Signed)
Unable to leave the patient voicemail's due to the number we have on file is incorrect. I have mailed a letter to the patient to contact our office. Please schedule if patient calls.

## 2018-01-08 ENCOUNTER — Encounter (HOSPITAL_COMMUNITY): Payer: Self-pay | Admitting: Emergency Medicine

## 2018-01-08 ENCOUNTER — Emergency Department (HOSPITAL_COMMUNITY)
Admission: EM | Admit: 2018-01-08 | Discharge: 2018-01-08 | Disposition: A | Discharge status: Home / Self Care | Payer: Medicaid Other | Attending: Emergency Medicine | Admitting: Emergency Medicine

## 2018-01-08 DIAGNOSIS — K5900 Constipation, unspecified: Secondary | ICD-10-CM | POA: Insufficient documentation

## 2018-01-08 DIAGNOSIS — K5909 Other constipation: Secondary | ICD-10-CM

## 2018-01-08 LAB — CBC WITH DIFFERENTIAL/PLATELET
Basophils Absolute: 0 10*3/uL (ref 0.0–0.1)
Basophils Relative: 0 %
Eosinophils Absolute: 0.1 10*3/uL (ref 0.0–0.7)
Eosinophils Relative: 1 %
HCT: 41.5 % (ref 36.0–46.0)
Hemoglobin: 13.5 g/dL (ref 12.0–15.0)
Lymphocytes Relative: 15 %
Lymphs Abs: 1.3 10*3/uL (ref 0.7–4.0)
MCH: 28.2 pg (ref 26.0–34.0)
MCHC: 32.5 g/dL (ref 30.0–36.0)
MCV: 86.6 fL (ref 78.0–100.0)
Monocytes Absolute: 0.9 10*3/uL (ref 0.1–1.0)
Monocytes Relative: 11 %
Neutro Abs: 6.4 10*3/uL (ref 1.7–7.7)
Neutrophils Relative %: 73 %
Platelets: 202 10*3/uL (ref 150–400)
RBC: 4.79 MIL/uL (ref 3.87–5.11)
RDW: 13.2 % (ref 11.5–15.5)
WBC: 8.7 10*3/uL (ref 4.0–10.5)

## 2018-01-08 LAB — URINALYSIS, ROUTINE W REFLEX MICROSCOPIC
Bilirubin Urine: NEGATIVE
Glucose, UA: NEGATIVE mg/dL
Hgb urine dipstick: NEGATIVE
Ketones, ur: NEGATIVE mg/dL
Leukocytes, UA: NEGATIVE
Nitrite: NEGATIVE
Protein, ur: 30 mg/dL — AB
Specific Gravity, Urine: 1.016 (ref 1.005–1.030)
pH: 9 — ABNORMAL HIGH (ref 5.0–8.0)

## 2018-01-08 LAB — COMPREHENSIVE METABOLIC PANEL
ALT: 29 U/L (ref 0–44)
AST: 30 U/L (ref 15–41)
Albumin: 3.7 g/dL (ref 3.5–5.0)
Alkaline Phosphatase: 38 U/L (ref 38–126)
Anion gap: 10 (ref 5–15)
BUN: 6 mg/dL (ref 6–20)
CO2: 26 mmol/L (ref 22–32)
Calcium: 8.7 mg/dL — ABNORMAL LOW (ref 8.9–10.3)
Chloride: 108 mmol/L (ref 98–111)
Creatinine, Ser: 0.78 mg/dL (ref 0.44–1.00)
GFR calc Af Amer: >60 mL/min (ref >60)
GFR calc non Af Amer: >60 mL/min (ref >60)
Glucose, Bld: 114 mg/dL — ABNORMAL HIGH (ref 70–99)
Potassium: 3.8 mmol/L (ref 3.5–5.1)
Sodium: 144 mmol/L (ref 135–145)
Total Bilirubin: 0.6 mg/dL (ref 0.3–1.2)
Total Protein: 6.7 g/dL (ref 6.5–8.1)

## 2018-01-08 LAB — PREGNANCY, URINE: Preg Test, Ur: NEGATIVE

## 2018-01-08 MED ORDER — BISACODYL 10 MG RE SUPP
10.0000 mg | Freq: Once | RECTAL | Status: AC
  Administered 2018-01-08: 10 mg via RECTAL
  Filled 2018-01-08: qty 1

## 2018-01-08 MED ORDER — DOCUSATE SODIUM 100 MG PO CAPS
100.0000 mg | ORAL_CAPSULE | Freq: Once | ORAL | Status: AC
  Administered 2018-01-08: 100 mg via ORAL
  Filled 2018-01-08: qty 1

## 2018-01-08 MED ORDER — SODIUM CHLORIDE 0.9 % IV BOLUS
1000.0000 mL | Freq: Once | INTRAVENOUS | Status: AC
  Administered 2018-01-08: 1000 mL via INTRAVENOUS

## 2018-01-08 NOTE — ED Provider Notes (Signed)
[name]-EMERGENCY DEPT Provider Note   CSN: [id number] Arrival date & time: 01/08/18  4098     History   Chief Complaint Chief Complaint  Patient presents with  . Constipation    HPI Mariah Leonard is a 22 y.o. female.  HPI Patient presents to the emergency department with constipation over the last 2 days.  Patient states that she feels like she has pressure in the rectal area but not a bowel movement.  Patient states she took mag citrate drink prune juice and Colace.  Patient states that she had this happen one time previously after having her child.  The patient states that she has had a recent upper respiratory infection with taking several different over-the-counter cough and cold remedies.  The patient denies chest pain, shortness of breath, headache,blurred vision, neck pain, fever, cough, weakness, numbness, dizziness, anorexia, edema, abdominal pain, nausea, vomiting, diarrhea, rash, back pain, dysuria, hematemesis, bloody stool, near syncope, or syncope. Past Medical History:  Diagnosis Date  . Infection    UTI    Patient Active Problem List   Diagnosis Date Noted  . Pregnant 01/25/2014  . NVD (normal vaginal delivery) 01/25/2014  . Premature uterine contractions 12/26/2013  . Preterm contractions 12/25/2013  . VOMITING 12/19/2008    Past Surgical History:  Procedure Laterality Date  . NO PAST SURGERIES       OB History    Gravida  1   Para  1   Term  1   Preterm      AB      Living  1     SAB      TAB      Ectopic      Multiple      Live Births  1            Home Medications    Prior to Admission medications   Medication Sig Start Date End Date Taking? Authorizing Provider  HYDROcodone-acetaminophen (NORCO/VICODIN) 5-325 MG tablet Take 1 tablet by mouth every 6 (six) hours as needed for moderate pain. Patient not taking: Reported on 01/08/2018 06/04/17   Faythe Ghee, PA-C    sulfamethoxazole-trimethoprim (BACTRIM DS,SEPTRA DS) 800-160 MG tablet Take 1 tablet by mouth 2 (two) times daily. Patient not taking: Reported on 01/08/2018 05/31/17   Tommi Rumps, PA-C    Family History No family history on file.  Social History Social History   Tobacco Use  . Smoking status: Never Smoker  . Smokeless tobacco: Never Used  Substance Use Topics  . Alcohol use: No  . Drug use: No     Allergies   Patient has no known allergies.   Review of Systems Review of Systems All other systems negative except as documented in the HPI. All pertinent positives and negatives as reviewed in the HPI.  Physical Exam Updated Vital Signs BP 124/79 (BP Location: Left Arm)   Pulse (!) 110   Temp 99 F (37.2 C) (Oral)   Resp 15   Ht 5\' 5"  (1.651 m)   Wt 58.5 kg   SpO2 96%   BMI 21.47 kg/m   Physical Exam  Constitutional: She is oriented to person, place, and time. She appears well-developed and well-nourished. No distress.  HENT:  Head: Normocephalic and atraumatic.  Mouth/Throat: Oropharynx is clear and moist.  Eyes: Pupils are equal, round, and reactive to light.  Neck: Normal range of motion. Neck supple.  Cardiovascular: Normal rate, regular rhythm and normal heart sounds.  Exam reveals no gallop and no friction rub.  No murmur heard. Pulmonary/Chest: Effort normal and breath sounds normal. No respiratory distress. She has no wheezes.  Abdominal: Soft. Bowel sounds are normal. She exhibits no distension. There is no tenderness. There is no guarding.  Neurological: She is alert and oriented to person, place, and time. She exhibits normal muscle tone. Coordination normal.  Skin: Skin is warm and dry. Capillary refill takes less than 2 seconds. No rash noted. No erythema.  Psychiatric: She has a normal mood and affect. Her behavior is normal.  Nursing note and vitals reviewed.    ED Treatments / Results  Labs (all labs ordered are listed, but only abnormal  results are displayed) Labs Reviewed  COMPREHENSIVE METABOLIC PANEL - Abnormal; Notable for the following components:      Result Value   Glucose, Bld 114 (*)    Calcium 8.7 (*)    All other components within normal limits  URINALYSIS, ROUTINE W REFLEX MICROSCOPIC - Abnormal; Notable for the following components:   APPearance CLOUDY (*)    pH 9.0 (*)    Protein, ur 30 (*)    Bacteria, UA FEW (*)    All other components within normal limits  PREGNANCY, URINE  CBC WITH DIFFERENTIAL/PLATELET    EKG None  Radiology No results found.  Procedures Procedures (including critical care time)  Medications Ordered in ED Medications  sodium chloride 0.9 % bolus 1,000 mL (1,000 mLs Intravenous New Bag/Given 01/08/18 1006)  bisacodyl (DULCOLAX) suppository 10 mg (10 mg Rectal Given 01/08/18 1017)  docusate sodium (COLACE) capsule 100 mg (100 mg Oral Given 01/08/18 1017)     Initial Impression / Assessment and Plan / ED Course  I have reviewed the triage vital signs and the nursing notes.  Pertinent labs & imaging results that were available during my care of the patient were reviewed by me and considered in my medical decision making (see chart for details).     Patient had a very large bowel movement here in the emergency department after being given a Dulcolax suppository Colace and IV fluids.  The patient will be discharged home.  I feel that the constipation was due to the upper story infection causing increasing dehydration along with several over-the-counter cough and cold remedies.  Have advised patient to increase her fluid intake and told her to return here as needed.  Patient agrees the plan and all questions were answered.  Final Clinical Impressions(s) / ED Diagnoses   Final diagnoses:  None    ED Discharge Orders    None       Charlestine NightLawyer, Yosselyn Tax, PA-C 01/08/18 1114    Margarita Grizzleay, Danielle, MD 01/08/18 1259

## 2018-01-08 NOTE — Discharge Instructions (Addendum)
Return here as needed. Follow up with your primary doctor. Increase your fluid intake. °

## 2018-01-08 NOTE — ED Triage Notes (Signed)
Patient here from home with complaints of constipation x2 days. Reports OTC meds with no relief, mag citrate, prune juice, stool softeners.

## 2018-01-13 ENCOUNTER — Encounter (HOSPITAL_COMMUNITY): Payer: Self-pay | Admitting: Emergency Medicine

## 2018-01-13 ENCOUNTER — Emergency Department (HOSPITAL_COMMUNITY)
Admission: EM | Admit: 2018-01-13 | Discharge: 2018-01-13 | Disposition: A | Discharge status: Home / Self Care | Payer: Medicaid Other | Attending: Emergency Medicine | Admitting: Emergency Medicine

## 2018-01-13 DIAGNOSIS — K5900 Constipation, unspecified: Secondary | ICD-10-CM

## 2018-01-13 DIAGNOSIS — K6289 Other specified diseases of anus and rectum: Secondary | ICD-10-CM | POA: Insufficient documentation

## 2018-01-13 LAB — COMPREHENSIVE METABOLIC PANEL
ALBUMIN: 3.8 g/dL (ref 3.5–5.0)
ALK PHOS: 40 U/L (ref 38–126)
ALT: 22 U/L (ref 0–44)
AST: 22 U/L (ref 15–41)
Anion gap: 8 (ref 5–15)
BUN: 13 mg/dL (ref 6–20)
CO2: 25 mmol/L (ref 22–32)
Calcium: 9.1 mg/dL (ref 8.9–10.3)
Chloride: 108 mmol/L (ref 98–111)
Creatinine, Ser: 0.81 mg/dL (ref 0.44–1.00)
GFR calc Af Amer: >60 mL/min (ref >60)
GFR calc non Af Amer: >60 mL/min (ref >60)
GLUCOSE: 107 mg/dL — AB (ref 70–99)
POTASSIUM: 4 mmol/L (ref 3.5–5.1)
SODIUM: 141 mmol/L (ref 135–145)
Total Bilirubin: 0.2 mg/dL — ABNORMAL LOW (ref 0.3–1.2)
Total Protein: 6.8 g/dL (ref 6.5–8.1)

## 2018-01-13 LAB — LIPASE, BLOOD: Lipase: 24 U/L (ref 11–51)

## 2018-01-13 LAB — URINALYSIS, ROUTINE W REFLEX MICROSCOPIC
Bilirubin Urine: NEGATIVE
GLUCOSE, UA: NEGATIVE mg/dL
Ketones, ur: NEGATIVE mg/dL
Leukocytes, UA: NEGATIVE
NITRITE: NEGATIVE
PROTEIN: NEGATIVE mg/dL
SPECIFIC GRAVITY, URINE: 1.02 (ref 1.005–1.030)
pH: 7 (ref 5.0–8.0)

## 2018-01-13 LAB — I-STAT BETA HCG BLOOD, ED (MC, WL, AP ONLY)

## 2018-01-13 LAB — CBC
HCT: 43.6 % (ref 36.0–46.0)
HEMOGLOBIN: 14.6 g/dL (ref 12.0–15.0)
MCH: 28.5 pg (ref 26.0–34.0)
MCHC: 33.5 g/dL (ref 30.0–36.0)
MCV: 85 fL (ref 78.0–100.0)
Platelets: 271 10*3/uL (ref 150–400)
RBC: 5.13 MIL/uL — ABNORMAL HIGH (ref 3.87–5.11)
RDW: 12.4 % (ref 11.5–15.5)
WBC: 7.8 10*3/uL (ref 4.0–10.5)

## 2018-01-13 MED ORDER — POLYETHYLENE GLYCOL 3350 17 G PO PACK: 17.0000 g | PACK | Freq: Every day | ORAL | 0 refills | Status: DC

## 2018-01-13 MED ORDER — HYDROCORTISONE 2.5 % RE CREA: TOPICAL_CREAM | RECTAL | 0 refills | Status: DC

## 2018-01-13 NOTE — ED Provider Notes (Signed)
[facility]-EMERGENCY DEPT Provider Note   CSN: [id number] Arrival date & time: 01/13/18  1141     History   Chief Complaint Chief Complaint  Patient presents with  . Abdominal Cramping    HPI Mariah Leonard is a 22 y.o. female.  HPI   Patient is a 22 year old female who presents the emergency department today for evaluation of rectal pain and cramping.  Patient states that she has had intermittent constipation recently and has been having hard stools.  She states that every time she has a bowel movement she has pain and cramping following the bowel movement.  Denies any bloody stools.  States she has been taking milk of magnesia at home to help with her bowel movements.  Denies any abdominal pain, nausea vomiting diarrhea or urinary symptoms.  No fevers or chills.  Denies any other symptoms.   Records reviewed.  Patient was seen on 01/08/2018 with complaints of constipation.  During that work-up, labs were reassuring and urine did not show evidence of urinary tract infection.  She was given IV fluids, Dulcolax and Colace in the ED.  She was able to have a large vomit in the ED which improved her symptoms.  Past Medical History:  Diagnosis Date  . Infection    UTI    Patient Active Problem List   Diagnosis Date Noted  . Pregnant 01/25/2014  . NVD (normal vaginal delivery) 01/25/2014  . Premature uterine contractions 12/26/2013  . Preterm contractions 12/25/2013  . VOMITING 12/19/2008    Past Surgical History:  Procedure Laterality Date  . NO PAST SURGERIES       OB History    Gravida  1   Para  1   Term  1   Preterm      AB      Living  1     SAB      TAB      Ectopic      Multiple      Live Births  1            Home Medications    Prior to Admission medications   Medication Sig Start Date End Date Taking? Authorizing Provider  HYDROcodone-acetaminophen (NORCO/VICODIN) 5-325 MG tablet Take 1 tablet by mouth every 6  (six) hours as needed for moderate pain. Patient not taking: Reported on 01/08/2018 06/04/17   Faythe Ghee, PA-C  hydrocortisone (ANUSOL-HC) 2.5 % rectal cream Apply rectally 2 times daily 01/13/18   Jamieka Royle S, PA-C  polyethylene glycol (MIRALAX) packet Take 17 g by mouth daily. Dissolve one cap full in solution (water, gatorade, etc.) and administer once cap-full daily. You may titrate up daily by 1 cap-full until the patient is having pudding consistency of stools. After the patient is able to start passing softer stools they will need to be on 1/2 cap-full daily for 2 weeks. 01/13/18   Cotey Rakes S, PA-C  sulfamethoxazole-trimethoprim (BACTRIM DS,SEPTRA DS) 800-160 MG tablet Take 1 tablet by mouth 2 (two) times daily. Patient not taking: Reported on 01/08/2018 05/31/17   Tommi Rumps, PA-C    Family History No family history on file.  Social History Social History   Tobacco Use  . Smoking status: Never Smoker  . Smokeless tobacco: Never Used  Substance Use Topics  . Alcohol use: No  . Drug use: No     Allergies   Patient has no known allergies.   Review of Systems Review of Systems  Constitutional: Negative for chills and fever.  HENT: Negative for ear pain and sore throat.   Eyes: Negative for pain and visual disturbance.  Respiratory: Negative for cough and shortness of breath.   Cardiovascular: Negative for chest pain and palpitations.  Gastrointestinal: Positive for constipation and rectal pain. Negative for abdominal pain, blood in stool, diarrhea, nausea and vomiting.  Genitourinary: Negative for dysuria, frequency, hematuria and urgency.  Musculoskeletal: Negative for back pain.  Skin: Negative for color change and rash.  Neurological: Negative for seizures and headaches.  All other systems reviewed and are negative.   Physical Exam Updated Vital Signs BP (!) 173/86   Pulse 64   Temp 98.2 F (36.8 C) (Oral)   Resp 15   LMP 12/29/2017   SpO2  100%   Physical Exam  Constitutional: She appears well-developed and well-nourished. No distress.  HENT:  Head: Normocephalic and atraumatic.  Eyes: Conjunctivae are normal.  Neck: Neck supple.  Cardiovascular: Normal rate and regular rhythm.  No murmur heard. Pulmonary/Chest: Effort normal and breath sounds normal. No respiratory distress.  Abdominal: Soft. Bowel sounds are normal. She exhibits no distension. There is no tenderness. There is no guarding.  Genitourinary:  Genitourinary Comments: Chaperone present. Digital rectal exam completed. No evidence of external hemorrhoids. No obvious internal hemorrhoids palpated. No obvious anal fissures.  Musculoskeletal: She exhibits no edema.  Neurological: She is alert.  Skin: Skin is warm and dry.  Psychiatric: She has a normal mood and affect.  Nursing note and vitals reviewed.   ED Treatments / Results  Labs (all labs ordered are listed, but only abnormal results are displayed) Labs Reviewed  COMPREHENSIVE METABOLIC PANEL - Abnormal; Notable for the following components:      Result Value   Glucose, Bld 107 (*)    Total Bilirubin 0.2 (*)    All other components within normal limits  CBC - Abnormal; Notable for the following components:   RBC 5.13 (*)    All other components within normal limits  URINALYSIS, ROUTINE W REFLEX MICROSCOPIC - Abnormal; Notable for the following components:   APPearance HAZY (*)    Hgb urine dipstick SMALL (*)    Bacteria, UA FEW (*)    All other components within normal limits  LIPASE, BLOOD  I-STAT BETA HCG BLOOD, ED ([facility], [facility], [facility] ONLY)    EKG None  Radiology No results found.  Procedures Procedures (including critical care time)  Medications Ordered in ED Medications - No data to display   Initial Impression / Assessment and Plan / ED Course  I have reviewed the triage vital signs and the nursing notes.  Pertinent labs & imaging results that were available during my care of the  patient were reviewed by me and considered in my medical decision making (see chart for details).    Final Clinical Impressions(s) / ED Diagnoses   Final diagnoses:  Rectal pain  Constipation, unspecified constipation type   Patient presenting complaining of rectal pain and rectal cramping every time she has a bowel movement.  Has been having hard stools.  Denies abdominal pain or other GI complaints.  Has been taking milk of magnesia for her symptoms of constipation.  Mildly hypertensive today, suspect due to pain.  Otherwise vitals stable.  Abdominal exam is benign.  Remainder of physical exam is benign.  Labs reassuring.  UA without evidence of UTI.  Rectal exam without evidence of external hemorrhoids no obvious internal hemorrhoids or anal fissures.  Will give Rx for  Anusol for symptomatic treatment as well as MiraLAX.  Gave GI referral if patient continues to have symptoms.  Advised to follow-up with your PCP as well in 1 week for reevaluation.  Advised her to return to the ER for any new or worsening symptoms in the meantime.  All questions answered patient stable for discharge.  ED Discharge Orders         Ordered    hydrocortisone (ANUSOL-HC) 2.5 % rectal cream     01/13/18 1440    polyethylene glycol (MIRALAX) packet  Daily     01/13/18 1440           Karrie Meres, PA-C 01/13/18 1446    Tegeler, Canary Brim, MD 01/13/18 [phone]

## 2018-01-13 NOTE — ED Triage Notes (Signed)
Pt reports she was seen here about week ago for constipation and reports that now after she has BMs she has abd cramping.

## 2018-01-13 NOTE — Discharge Instructions (Signed)
Please take all medications as prescribed.  You were given a referral to the gastroenterology doctor.  If you would like to follow-up with them you need to call the office to make an appointment.  You may also follow-up with your primary care doctor in 1 week for reevaluation.  If you have any new or worsening symptoms in the meantime he may return to the emergency department.

## 2019-05-13 NOTE — L&D Delivery Note (Addendum)
OB/GYN Faculty Practice Delivery Note  Mariah Leonard is a 24 y.o. G2P1001 s/p SVD at [redacted]w[redacted]d. She was admitted for SOL.   ROM: 0h 33m with meconium stained fluid GBS Status: unknown   Maximum Maternal Temperature: 97.6 F  Labor Progress: . Patient presented to L&D for SOL. Initial SVE: 6.5/80/0. Labor course was complicated by unknown GBS status. She then progressed to complete.   Delivery Date/Time: 12:38 Delivery: Called to room and patient was complete and pushing. Head position was ROA and delivered with ease over the perineum. No nuchal cord present. Shoulder and body delivered in usual fashion. Infant with spontaneous cry, placed on mother's abdomen, dried and stimulated. Cord clamped x 2 after 1-minute delay, and cut by grandmother. Cord blood drawn. Placenta delivered spontaneously with gentle cord traction. Fundus firm with massage and pitocin started. Labia, perineum, vagina, and cervix inspected and significant for  Hemostatic right labia abrasion.  Baby Weight: 2946gm (6lb 7.9oz)  Cord: central insertion, 3 vessel Placenta: Sent to L&D Complications: none Lacerations: none EBL: 500cc Analgesia: Epidural   Infant: APGAR (1 MIN):   APGAR (5 MINS):    Mariah Greathouse, MD, PGY-1 OBGYN Faculty Teaching Service  03/09/2020, 1:00 PM   Patient is a [id number] at [redacted]w[redacted]d who was admitted with SOL, significant hx of limited [facility] & dating by 35wk scan.  She progressed without augmentation.  I was gloved and present for delivery in its entirety.  Second stage of labor progressed, baby delivered after pushing approx 15 mins.  Mild decels during second stage noted.  Complications: none  Lacerations: none  EBL: 500cc  Mariah Leonard, CNM 5:35 PM

## 2019-10-12 ENCOUNTER — Encounter (HOSPITAL_COMMUNITY): Payer: Self-pay | Admitting: Obstetrics and Gynecology

## 2019-10-12 ENCOUNTER — Other Ambulatory Visit: Payer: Self-pay

## 2019-10-12 ENCOUNTER — Inpatient Hospital Stay (HOSPITAL_COMMUNITY)
Admission: AD | Admit: 2019-10-12 | Discharge: 2019-10-12 | Disposition: A | Discharge status: Home / Self Care | Payer: Medicaid Other | Attending: Obstetrics and Gynecology | Admitting: Obstetrics and Gynecology

## 2019-10-12 DIAGNOSIS — O23591 Infection of other part of genital tract in pregnancy, first trimester: Secondary | ICD-10-CM | POA: Diagnosis not present

## 2019-10-12 DIAGNOSIS — R109 Unspecified abdominal pain: Secondary | ICD-10-CM | POA: Insufficient documentation

## 2019-10-12 DIAGNOSIS — A5901 Trichomonal vulvovaginitis: Secondary | ICD-10-CM | POA: Diagnosis not present

## 2019-10-12 DIAGNOSIS — Z3A12 12 weeks gestation of pregnancy: Secondary | ICD-10-CM | POA: Diagnosis not present

## 2019-10-12 DIAGNOSIS — O98311 Other infections with a predominantly sexual mode of transmission complicating pregnancy, first trimester: Secondary | ICD-10-CM | POA: Diagnosis not present

## 2019-10-12 DIAGNOSIS — O26891 Other specified pregnancy related conditions, first trimester: Secondary | ICD-10-CM | POA: Insufficient documentation

## 2019-10-12 LAB — URINALYSIS, MICROSCOPIC (REFLEX)

## 2019-10-12 LAB — POCT PREGNANCY, URINE: Preg Test, Ur: POSITIVE — AB

## 2019-10-12 LAB — URINALYSIS, ROUTINE W REFLEX MICROSCOPIC
Bilirubin Urine: NEGATIVE
Glucose, UA: NEGATIVE mg/dL
Ketones, ur: NEGATIVE mg/dL
Nitrite: NEGATIVE
Protein, ur: NEGATIVE mg/dL
Specific Gravity, Urine: 1.015 (ref 1.005–1.030)
pH: 7.5 (ref 5.0–8.0)

## 2019-10-12 LAB — WET PREP, GENITAL
Clue Cells Wet Prep HPF POC: NONE SEEN
Sperm: NONE SEEN
Yeast Wet Prep HPF POC: NONE SEEN

## 2019-10-12 LAB — HIV ANTIBODY (ROUTINE TESTING W REFLEX): HIV Screen 4th Generation wRfx: NONREACTIVE

## 2019-10-12 MED ORDER — METRONIDAZOLE 500 MG PO TABS
2000.0000 mg | ORAL_TABLET | Freq: Once | ORAL | Status: AC
  Administered 2019-10-12: 2000 mg via ORAL
  Filled 2019-10-12: qty 4

## 2019-10-12 NOTE — MAU Provider Note (Signed)
History     CSN: [id number]  Arrival date and time: 10/12/19 3016   First Provider Initiated Contact with Patient 10/12/19 1009      Chief Complaint  Patient presents with   Abdominal Pain   HPI   Ms.Mariah Leonard is a 24 y.o. female G2P1001 @ [redacted]w[redacted]d here with abdominal pain. This is a new problem. The pain started on Monday. Laying down at night makes the pain worse. The pain is located in the middle of her lower abdomen. The pain comes and goes. No dysuria. + frequency.  Has not tried any pain medication OTC.   OB History    Gravida  2   Para  1   Term  1   Preterm      AB      Living  1     SAB      TAB      Ectopic      Multiple      Live Births  1           Past Medical History:  Diagnosis Date   Infection    UTI    Past Surgical History:  Procedure Laterality Date   NO PAST SURGERIES      History reviewed. No pertinent family history.  Social History   Tobacco Use   Smoking status: Never Smoker   Smokeless tobacco: Never Used  Substance Use Topics   Alcohol use: No   Drug use: No    Allergies: No Known Allergies  Medications Prior to Admission  Medication Sig Dispense Refill Last Dose   HYDROcodone-acetaminophen (NORCO/VICODIN) 5-325 MG tablet Take 1 tablet by mouth every 6 (six) hours as needed for moderate pain. (Patient not taking: Reported on 01/08/2018) 8 tablet 0    hydrocortisone (ANUSOL-HC) 2.5 % rectal cream Apply rectally 2 times daily 28.35 g 0    polyethylene glycol (MIRALAX) packet Take 17 g by mouth daily. Dissolve one cap full in solution (water, gatorade, etc.) and administer once cap-full daily. You may titrate up daily by 1 cap-full until the patient is having pudding consistency of stools. After the patient is able to start passing softer stools they will need to be on 1/2 cap-full daily for 2 weeks. 14 each 0    sulfamethoxazole-trimethoprim (BACTRIM DS,SEPTRA DS) 800-160 MG tablet Take 1 tablet by  mouth 2 (two) times daily. (Patient not taking: Reported on 01/08/2018) 20 tablet 0    Results for orders placed or performed during the hospital encounter of 10/12/19 (from the past 48 hour(s))  Pregnancy, urine POC     Status: Abnormal   Collection Time: 10/12/19  9:05 AM  Result Value Ref Range   Preg Test, Ur POSITIVE (A) NEGATIVE    Comment:        THE SENSITIVITY OF THIS METHODOLOGY IS >24 mIU/mL   Urinalysis, Routine w reflex microscopic     Status: Abnormal   Collection Time: 10/12/19  9:07 AM  Result Value Ref Range   Color, Urine YELLOW YELLOW   APPearance CLOUDY (A) CLEAR   Specific Gravity, Urine 1.015 1.005 - 1.030   pH 7.5 5.0 - 8.0   Glucose, UA NEGATIVE NEGATIVE mg/dL   Hgb urine dipstick MODERATE (A) NEGATIVE   Bilirubin Urine NEGATIVE NEGATIVE   Ketones, ur NEGATIVE NEGATIVE mg/dL   Protein, ur NEGATIVE NEGATIVE mg/dL   Nitrite NEGATIVE NEGATIVE   Leukocytes,Ua MODERATE (A) NEGATIVE    Comment: Performed at [facility],  [address], [place], [place] [address]  Urinalysis, Microscopic (reflex)     Status: Abnormal   Collection Time: 10/12/19  9:07 AM  Result Value Ref Range   RBC / HPF 0-5 0 - 5 RBC/hpf   WBC, UA 0-5 0 - 5 WBC/hpf   Bacteria, UA FEW (A) NONE SEEN   Squamous Epithelial / LPF 6-10 0 - 5   Urine-Other TRICHOMONAS PRESENT     Comment: Performed at [facility], [place] [address], [place], [place] [address]  HIV Antibody (routine testing w rflx)     Status: None   Collection Time: 10/12/19 10:24 AM  Result Value Ref Range   HIV Screen 4th Generation wRfx Non Reactive Non Reactive    Comment: Performed at [facility], [address], [place], [place] [address]  Wet prep, genital     Status: Abnormal   Collection Time: 10/12/19 10:51 AM  Result Value Ref Range   Yeast Wet Prep HPF POC NONE SEEN NONE SEEN   Trich, Wet Prep PRESENT (A) NONE SEEN   Clue Cells Wet Prep HPF POC NONE SEEN NONE SEEN   WBC, Wet Prep HPF POC MANY (A)  NONE SEEN   Sperm NONE SEEN     Comment: Performed at [facility], [place] [address], [place], [place] [address]   Review of Systems  Constitutional: Negative for fever.  Gastrointestinal: Positive for abdominal pain. Negative for nausea and vomiting.  Genitourinary: Negative for flank pain and urgency.  Musculoskeletal: Negative for back pain.   Physical Exam   Blood pressure 112/65, pulse 70, temperature 98.3 F (36.8 C), temperature source Oral, resp. rate 16, last menstrual period 07/16/2019, SpO2 100 %, unknown if currently breastfeeding.  Physical Exam  Constitutional: She is oriented to person, place, and time. She appears well-developed and well-nourished. No distress.  HENT:  Head: Normocephalic.  Eyes: Pupils are equal, round, and reactive to light.  GI: Soft. She exhibits no distension. There is no abdominal tenderness. There is no rebound and no guarding.  Genitourinary:    Genitourinary Comments: Wet prep and GC collected without speculum    Neurological: She is alert and oriented to person, place, and time.  Skin: Skin is warm. She is not diaphoretic.  Psychiatric: Her behavior is normal.   MAU Course  Procedures  None  MDM  + fetal heart tones via doppler + trich on urine Flagyl 2 grams given PO GC collected/wet prep   Assessment and Plan   A:  1. Trichomonas vaginitis   2. Abdominal pain in pregnancy, first trimester     P:  Discharge home in stable condition Return to MAU if symptoms worsen  Partner needs treatment Patient requests a referral to [facility] office. Message sent to office.   Noni Saupe I, NP 10/12/2019 2:05 PM

## 2019-10-12 NOTE — MAU Note (Signed)
Pt providing urine sample at this time.

## 2019-10-12 NOTE — Discharge Instructions (Signed)
Trichomonas Test Why am I having this test? The trichomonas test is done to diagnose trichomoniasis, a sexually-transmitted infection (STI) caused by an organism called Trichomonas. You may have this test as a part of a routine screening for STIs or if you have symptoms of trichomoniasis. What kind of sample is taken? To perform the test, your health care provider will either ask you to provide a urine sample or take a sample of discharge. The sample will be taken from the vagina or cervix in women and from the urethra in men. How are the results reported? Your test results will be reported as either positive or negative. It is up to you to get your test results. Ask your health care provider, or the department that is doing the test, when your results will be ready. What do the results mean? Negative test result A negative result means that you do not have trichomoniasis. Follow your health care provider's directions about any follow-up testing. Positive test result A positive result means that you have an active infection that needs to be treated with antibiotic medicine. All your current sexual partners must also be treated. If they are not, you will likely get reinfected. If your test result is positive, your health care provider will start you on medicine and may advise you:  Not to have sex until your infection has cleared up.  To use a latex condom properly every time you have sex.  To limit the number of sexual partners you have. The more partners you have, the greater your risk of contracting trichomoniasis or another STI.  To tell all sexual partners about your infection so that they can also get tested and treated. This will prevent reinfection. Talk with your health care provider to discuss your results, treatment options, and if necessary, the need for more tests. Talk with your health care provider if you have any questions about your results. This information is not intended to  replace advice given to you by your health care provider. Make sure you discuss any questions you have with your health care provider. Document Revised: 04/10/2017 Document Reviewed: 06/24/2016 Elsevier Patient Education  2020 [employer] Inc[employer]  

## 2019-10-12 NOTE — MAU Note (Signed)
Mariah Leonard is a 24 y.o. at [redacted]w[redacted]d here in MAU reporting: +UPT at home about 2 weeks ago. Having lower abdominal pain since Monday, states she had the same pain when she was pregnant the first time. No bleeding or discharge.  LMP: 07/16/19 approximately, states only bled for 2 days  Onset of complaint: Monday  Pain score: 5/10  Vitals:   10/12/19 0916  BP: 112/65  Pulse: 70  Resp: 16  Temp: 98.3 F (36.8 C)  SpO2: 100%     FHT: 159  Lab orders placed from triage: UA, UPT

## 2019-10-13 LAB — GC/CHLAMYDIA PROBE AMP (~~LOC~~) NOT AT ARMC
Chlamydia: POSITIVE — AB
Comment: NEGATIVE
Comment: NORMAL
Neisseria Gonorrhea: NEGATIVE

## 2019-10-17 ENCOUNTER — Other Ambulatory Visit: Payer: Self-pay | Admitting: Obstetrics and Gynecology

## 2019-10-17 DIAGNOSIS — A599 Trichomoniasis, unspecified: Secondary | ICD-10-CM

## 2019-10-17 DIAGNOSIS — A749 Chlamydial infection, unspecified: Secondary | ICD-10-CM | POA: Insufficient documentation

## 2019-10-17 MED ORDER — AZITHROMYCIN 500 MG PO TABS: 1000.0000 mg | ORAL_TABLET | Freq: Every day | ORAL | 0 refills | Status: DC

## 2019-10-17 NOTE — Progress Notes (Signed)
Notified Ms. Kipp of + chlamydia results. Rx: Azithromycin given  Partner is aware and plans to get tested and treated.   Duane Lope, NP 10/17/2019 1:49 PM

## 2019-10-20 ENCOUNTER — Ambulatory Visit (HOSPITAL_BASED_OUTPATIENT_CLINIC_OR_DEPARTMENT_OTHER): Payer: Medicaid Other

## 2019-10-20 DIAGNOSIS — Z3480 Encounter for supervision of other normal pregnancy, unspecified trimester: Secondary | ICD-10-CM | POA: Insufficient documentation

## 2019-10-20 DIAGNOSIS — Z3483 Encounter for supervision of other normal pregnancy, third trimester: Secondary | ICD-10-CM | POA: Diagnosis not present

## 2019-10-20 DIAGNOSIS — Z3A39 39 weeks gestation of pregnancy: Secondary | ICD-10-CM

## 2019-10-20 MED ORDER — BLOOD PRESSURE KIT: 1.0000 | PACK | Freq: Once | 0 refills | Status: AC

## 2019-10-20 NOTE — Progress Notes (Signed)
I connected with Erma Raiche Villamar on 10/20/19 at  9:00 AM EDT by telephone and verified that I am speaking with the correct person using two identifiers.   I discussed the limitations, risks, security and privacy concerns of performing an evaluation and management service by telephone and the availability of in person appointments. I also discussed with the patient that there may be a patient responsible charge related to this service. The patient expressed understanding and agreed to proceed.   OB History  Gravida Para Term Preterm AB Living  2 1 1     1   SAB TAB Ectopic Multiple Live Births          1    # Outcome Date GA Lbr Len/2nd Weight Sex Delivery Anes PTL Lv  2 Current           1 Term 01/25/14 [redacted]w[redacted]d  7 lb 8 oz (3.402 kg) F Vag-Spont EPI  LIV   Assessment and Plan:  Normal Pregnancy  Follow Up Instructions:  10/24/19   I discussed the assessment and treatment plan with the patient. The patient was provided an opportunity to ask questions and all were answered. The patient agreed with the plan and demonstrated an understanding of the instructions.   The patient was advised to call back or seek an in-person evaluation if the symptoms worsen or if the condition fails to improve as anticipated.  I provided 10 minutes of non-face-to-face time during this encounter.

## 2019-10-22 DIAGNOSIS — Z3400 Encounter for supervision of normal first pregnancy, unspecified trimester: Secondary | ICD-10-CM | POA: Diagnosis not present

## 2019-10-24 ENCOUNTER — Encounter: Payer: Self-pay | Admitting: Obstetrics

## 2019-10-24 ENCOUNTER — Ambulatory Visit (INDEPENDENT_AMBULATORY_CARE_PROVIDER_SITE_OTHER): Payer: Medicaid Other | Admitting: Obstetrics

## 2019-10-24 ENCOUNTER — Other Ambulatory Visit (HOSPITAL_COMMUNITY)
Admission: RE | Admit: 2019-10-24 | Discharge: 2019-10-24 | Disposition: A | Discharge status: Home / Self Care | Payer: Medicaid Other | Source: Ambulatory Visit | Attending: Obstetrics | Admitting: Obstetrics

## 2019-10-24 ENCOUNTER — Other Ambulatory Visit: Payer: Self-pay

## 2019-10-24 VITALS — BP 103/62 | HR 73 | Wt 126.0 lb

## 2019-10-24 DIAGNOSIS — Z315 Encounter for genetic counseling: Secondary | ICD-10-CM | POA: Diagnosis not present

## 2019-10-24 DIAGNOSIS — A599 Trichomoniasis, unspecified: Secondary | ICD-10-CM

## 2019-10-24 DIAGNOSIS — A749 Chlamydial infection, unspecified: Secondary | ICD-10-CM

## 2019-10-24 DIAGNOSIS — Z3480 Encounter for supervision of other normal pregnancy, unspecified trimester: Secondary | ICD-10-CM

## 2019-10-24 DIAGNOSIS — Z3A14 14 weeks gestation of pregnancy: Secondary | ICD-10-CM | POA: Diagnosis not present

## 2019-10-24 DIAGNOSIS — Z3482 Encounter for supervision of other normal pregnancy, second trimester: Secondary | ICD-10-CM | POA: Diagnosis not present

## 2019-10-24 MED ORDER — BLOOD PRESSURE MONITOR KIT: 1.0000 | PACK | 0 refills | Status: DC

## 2019-10-24 MED ORDER — VITAFOL ULTRA 29-0.6-0.4-200 MG PO CAPS: 1.0000 | ORAL_CAPSULE | Freq: Every day | ORAL | 4 refills | Status: DC

## 2019-10-24 NOTE — Progress Notes (Signed)
Subjective:    Mariah Leonard is being seen today for her first obstetrical visit.  This is not a planned pregnancy. She is at 8w2dgestation. Her obstetrical history is significant for none. Relationship with FOB: significant other, living together. Patient does intend to breast feed. Pregnancy history fully reviewed.  The information documented in the HPI was reviewed and verified.  Menstrual History: OB History    Gravida  2   Para  1   Term  1   Preterm      AB      Living  1     SAB      TAB      Ectopic      Multiple      Live Births  1            Patient's last menstrual period was 07/16/2019 (approximate).    Past Medical History:  Diagnosis Date   Infection    UTI    Past Surgical History:  Procedure Laterality Date   NO PAST SURGERIES      (Not in a hospital admission)  No Known Allergies  Social History   Tobacco Use   Smoking status: Never Smoker   Smokeless tobacco: Never Used  Substance Use Topics   Alcohol use: No    History reviewed. No pertinent family history.   Review of Systems Constitutional: negative for weight loss Gastrointestinal: negative for vomiting Genitourinary:negative for genital lesions and vaginal discharge and dysuria Musculoskeletal:negative for back pain Behavioral/Psych: negative for abusive relationship, depression, illegal drug usage and tobacco use    Objective:    BP 103/62    Pulse 73    Wt 126 lb (57.2 kg)    LMP 07/16/2019 (Approximate)    BMI 20.97 kg/m  General Appearance:    Alert, cooperative, no distress, appears stated age  Head:    Normocephalic, without obvious abnormality, atraumatic  Eyes:    PERRL, conjunctiva/corneas clear, EOM's intact, fundi    benign, both eyes  Ears:    Normal TM's and external ear canals, both ears  Nose:   Nares normal, septum midline, mucosa normal, no drainage    or sinus tenderness  Throat:   Lips, mucosa, and tongue normal; teeth and gums normal   Neck:   Supple, symmetrical, trachea midline, no adenopathy;    thyroid:  no enlargement/tenderness/nodules; no carotid   bruit or JVD  Back:     Symmetric, no curvature, ROM normal, no CVA tenderness  Lungs:     Clear to auscultation bilaterally, respirations unlabored  Chest Wall:    No tenderness or deformity   Heart:    Regular rate and rhythm, S1 and S2 normal, no murmur, rub   or gallop  Breast Exam:    No tenderness, masses, or nipple abnormality  Abdomen:     Soft, non-tender, bowel sounds active all four quadrants,    no masses, no organomegaly  Genitalia:    Normal female without lesion, discharge or tenderness  Extremities:   Extremities normal, atraumatic, no cyanosis or edema  Pulses:   2+ and symmetric all extremities  Skin:   Skin color, texture, turgor normal, no rashes or lesions  Lymph nodes:   Cervical, supraclavicular, and axillary nodes normal  Neurologic:   CNII-XII intact, normal strength, sensation and reflexes    throughout      Lab Review Urine pregnancy test Labs reviewed yes Radiologic studies reviewed no  Assessment:    Pregnancy at  12w2dweeks    Plan:     1. Supervision of other normal pregnancy, antepartum Rx: - Cytology - PAP( [facility]) - Genetic Screening - Culture, OB Urine - Obstetric Panel, Including HIV - Hepatitis C antibody - Prenat-Fe Poly-Methfol-FA-DHA (VITAFOL ULTRA) 29-0.6-0.4-200 MG CAPS; Take 1 capsule by mouth daily before breakfast.  Dispense: 90 capsule; Refill: 4  2. Chlamydia - treated.   - repeat cultures at 28 weeks  3. Trichomonas infection - treated - repeat cultures at 28 weeks   Prenatal vitamins.  Counseling provided regarding continued use of seat belts, cessation of alcohol consumption, smoking or use of illicit drugs; infection precautions i.e., influenza/TDAP immunizations, toxoplasmosis,CMV, parvovirus, listeria and varicella; workplace safety, exercise during pregnancy; routine dental care, safe  medications, sexual activity, hot tubs, saunas, pools, travel, caffeine use, fish and methlymercury, potential toxins, hair treatments, varicose veins Weight gain recommendations per IOM guidelines reviewed: underweight/BMI< 18.5--> gain 28 - 40 lbs; normal weight/BMI 18.5 - 24.9--> gain 25 - 35 lbs; overweight/BMI 25 - 29.9--> gain 15 - 25 lbs; obese/BMI >30->gain  11 - 20 lbs Problem list reviewed and updated. FIRST/CF mutation testing/NIPT/QUAD SCREEN/fragile X/Ashkenazi Jewish population testing/Spinal muscular atrophy discussed: requested. Role of ultrasound in pregnancy discussed; fetal survey: requested. Amniocentesis discussed: not indicated.  Meds ordered this encounter  Medications   DISCONTD: Blood Pressure Monitor KIT    Sig: 1 Device by Does not apply route once a week. To be monitored Regularly at home.    Dispense:  1 kit    Refill:  0   Prenat-Fe Poly-Methfol-FA-DHA (VITAFOL ULTRA) 29-0.6-0.4-200 MG CAPS    Sig: Take 1 capsule by mouth daily before breakfast.    Dispense:  90 capsule    Refill:  4   Orders Placed This Encounter  Procedures   Culture, OB Urine   Genetic Screening    [facility]   Obstetric Panel, Including HIV   Hepatitis C antibody    Follow up in 4 weeks. 50% of 25 min visit spent on counseling and coordination of care.    HShelly Bombard MD 10/24/2019 11:32 AM

## 2019-10-24 NOTE — Progress Notes (Signed)
NOB: Intake completed on 10/20/19  Last pap: 08/15/2013  Planned Pregnancy: Yes   Genetic Screening: Desires   CC: None   * Had GC/CT on 10/12/19 + CT

## 2019-10-25 ENCOUNTER — Other Ambulatory Visit: Payer: Self-pay | Admitting: Obstetrics

## 2019-10-25 DIAGNOSIS — O99019 Anemia complicating pregnancy, unspecified trimester: Secondary | ICD-10-CM

## 2019-10-25 LAB — OBSTETRIC PANEL, INCLUDING HIV
Antibody Screen: NEGATIVE
Basophils Absolute: 0 10*3/uL (ref 0.0–0.2)
Basos: 1 %
EOS (ABSOLUTE): 0.1 10*3/uL (ref 0.0–0.4)
Eos: 1 %
HIV Screen 4th Generation wRfx: NONREACTIVE
Hematocrit: 30.8 % — ABNORMAL LOW (ref 34.0–46.6)
Hemoglobin: 10.2 g/dL — ABNORMAL LOW (ref 11.1–15.9)
Hepatitis B Surface Ag: NEGATIVE
Immature Grans (Abs): 0 10*3/uL (ref 0.0–0.1)
Immature Granulocytes: 1 %
Lymphocytes Absolute: 1.6 10*3/uL (ref 0.7–3.1)
Lymphs: 27 %
MCH: 29.5 pg (ref 26.6–33.0)
MCHC: 33.1 g/dL (ref 31.5–35.7)
MCV: 89 fL (ref 79–97)
Monocytes Absolute: 0.6 10*3/uL (ref 0.1–0.9)
Monocytes: 9 %
Neutrophils Absolute: 3.8 10*3/uL (ref 1.4–7.0)
Neutrophils: 61 %
Platelets: 212 10*3/uL (ref 150–450)
RBC: 3.46 x10E6/uL — ABNORMAL LOW (ref 3.77–5.28)
RDW: 12.3 % (ref 11.7–15.4)
RPR Ser Ql: NONREACTIVE
Rh Factor: POSITIVE
Rubella Antibodies, IGG: 1.23 index (ref >0.99)
WBC: 6.1 10*3/uL (ref 3.4–10.8)

## 2019-10-25 LAB — CYTOLOGY - PAP: Diagnosis: NEGATIVE

## 2019-10-25 LAB — HEPATITIS C ANTIBODY: Hep C Virus Ab: <0.1 s/co ratio (ref 0.0–0.9)

## 2019-10-25 MED ORDER — FERROUS SULFATE 325 (65 FE) MG PO TABS: 325.0000 mg | ORAL_TABLET | Freq: Two times a day (BID) | ORAL | 5 refills | Status: DC

## 2019-10-27 LAB — CULTURE, OB URINE

## 2019-10-27 LAB — URINE CULTURE, OB REFLEX

## 2019-10-28 ENCOUNTER — Encounter (HOSPITAL_COMMUNITY): Payer: Self-pay | Admitting: Obstetrics & Gynecology

## 2019-10-28 ENCOUNTER — Other Ambulatory Visit: Payer: Self-pay

## 2019-10-28 ENCOUNTER — Inpatient Hospital Stay (HOSPITAL_COMMUNITY)
Admission: AD | Admit: 2019-10-28 | Discharge: 2019-10-28 | Disposition: A | Discharge status: Home / Self Care | Payer: Medicaid Other | Attending: Obstetrics & Gynecology | Admitting: Obstetrics & Gynecology

## 2019-10-28 DIAGNOSIS — Z3A14 14 weeks gestation of pregnancy: Secondary | ICD-10-CM | POA: Diagnosis not present

## 2019-10-28 DIAGNOSIS — O219 Vomiting of pregnancy, unspecified: Secondary | ICD-10-CM | POA: Diagnosis not present

## 2019-10-28 LAB — URINALYSIS, ROUTINE W REFLEX MICROSCOPIC
Bilirubin Urine: NEGATIVE
Glucose, UA: NEGATIVE mg/dL
Ketones, ur: NEGATIVE mg/dL
Nitrite: NEGATIVE
Protein, ur: NEGATIVE mg/dL
Specific Gravity, Urine: 1.013 (ref 1.005–1.030)
pH: 6 (ref 5.0–8.0)

## 2019-10-28 MED ORDER — ONDANSETRON 4 MG PO TBDP: 4.0000 mg | ORAL_TABLET | Freq: Four times a day (QID) | ORAL | 1 refills | Status: DC | PRN

## 2019-10-28 NOTE — MAU Note (Signed)
Pt stated she started having nausea this morning and vomited after she ate. Still can't keep anything down, Having abd cramping after she vomits. Has not had problems with N/V so far with this pregnancy.

## 2019-10-28 NOTE — MAU Provider Note (Signed)
History     CSN: [id number]  Arrival date and time: 10/28/19 1450   First Provider Initiated Contact with Patient 10/28/19 1610      Chief Complaint  Patient presents with  . Nausea  . Emesis   Mariah Leonard is a 24 y.o. G2P1001 at [redacted]w[redacted]d who receives care at [facility].  She presents today for Nausea and Emesis.  Patient states that she has new onset nausea and vomiting, but did not experience this with her previous pregnancy.  Patient reports she has had 3 incidents of vomiting today and 2 incidents yesterday.  Patient states the vomiting only occurs when she eats and she feels like she "can't keep anything down." She reports she has not taken anything for her nausea.  Patient reports she ate around 2pm and had [employer]: whopper, fries.  She reports she threw up after.  Patient denies vaginal concerns including bleeding, discharge, and leaking.  Patient denies abdominal pain, but reports cramping with vomiting.    OB History    Gravida  2   Para  1   Term  1   Preterm      AB      Living  1     SAB      TAB      Ectopic      Multiple      Live Births  1           Past Medical History:  Diagnosis Date  . Infection    UTI    Past Surgical History:  Procedure Laterality Date  . NO PAST SURGERIES      History reviewed. No pertinent family history.  Social History   Tobacco Use  . Smoking status: Never Smoker  . Smokeless tobacco: Never Used  Vaping Use  . Vaping Use: Never used  Substance Use Topics  . Alcohol use: No  . Drug use: No    Allergies: No Known Allergies  Medications Prior to Admission  Medication Sig Dispense Refill Last Dose  . ferrous sulfate 325 (65 FE) MG tablet Take 1 tablet (325 mg total) by mouth 2 (two) times daily with a meal. 60 tablet 5 10/28/2019 at Unknown time  . Prenat-Fe Poly-Methfol-FA-DHA (VITAFOL ULTRA) 29-0.6-0.4-200 MG CAPS Take 1 capsule by mouth daily before breakfast. 90 capsule 4 10/28/2019 at  Unknown time    Review of Systems  Gastrointestinal: Positive for nausea (None currently) and vomiting (None currently). Negative for abdominal pain, constipation and diarrhea.  Genitourinary: Negative for difficulty urinating, dysuria, vaginal bleeding and vaginal discharge.  Neurological: Negative for dizziness, light-headedness and headaches.   Physical Exam   Blood pressure (!) 95/54, pulse 70, temperature 98.4 F (36.9 C), resp. rate 18, height 5\' 5"  (1.651 m), weight 59 kg, last menstrual period 07/16/2019, unknown if currently breastfeeding.  Physical Exam  Constitutional: She is oriented to person, place, and time. No distress.  HENT:  Head: Normocephalic and atraumatic.  Eyes: Conjunctivae are normal.  Cardiovascular: Normal rate, regular rhythm and normal heart sounds.  Respiratory: Effort normal and breath sounds normal. No respiratory distress.  GI: Soft. Normal appearance and bowel sounds are normal. There is no abdominal tenderness.  Gravid--fundal height appears LGA, Soft, NT   Musculoskeletal:     Cervical back: Normal range of motion.  Neurological: She is alert and oriented to person, place, and time.  Skin: Skin is warm and dry.  Psychiatric: Mood and thought content normal.    MAU Course  Procedures  MDM Physical Exam Education Assessment and Plan  24 year old, G2P1001  SIUP at 14.6 weeks Nausea  -Patient informed that based on complaints no need for treatment while in hospital.  -Offered and accepts script for Zofran. -Discussed limited research of how Zofran has been linked to birth defects.  Reassured that palate is developed and only growing at this stage.   -Patient states that she took Zofran in the past without issues. -Rx for Zofran 4mg  Disp 30, RF 0 sent to pharmacy on file.  -Cautioned that Zofran and Iron supplement can cause constipation. -Encouraged to notify staff if bowel regime has drastic change. -Patient verbalized understanding  and without further questions or concerns. -Requests and given work note. -Encouraged to call or return to MAU if symptoms worsen or with the onset of new symptoms. -Discharged to home in stable condition.  Maryann Conners 10/28/2019, 4:10 PM

## 2019-10-28 NOTE — Discharge Instructions (Signed)

## 2019-10-31 ENCOUNTER — Encounter: Payer: Self-pay | Admitting: Obstetrics

## 2019-11-01 ENCOUNTER — Encounter: Payer: Self-pay | Admitting: Obstetrics

## 2019-11-08 ENCOUNTER — Encounter: Payer: Self-pay | Admitting: Obstetrics

## 2019-11-21 ENCOUNTER — Telehealth (INDEPENDENT_AMBULATORY_CARE_PROVIDER_SITE_OTHER): Payer: Medicaid Other | Admitting: Obstetrics

## 2019-11-21 ENCOUNTER — Encounter: Payer: Self-pay | Admitting: Obstetrics

## 2019-11-21 DIAGNOSIS — O98319 Other infections with a predominantly sexual mode of transmission complicating pregnancy, unspecified trimester: Secondary | ICD-10-CM

## 2019-11-21 DIAGNOSIS — A749 Chlamydial infection, unspecified: Secondary | ICD-10-CM

## 2019-11-21 DIAGNOSIS — A599 Trichomoniasis, unspecified: Secondary | ICD-10-CM

## 2019-11-21 NOTE — Progress Notes (Signed)
Patient did not answer nurse's phone call.  Brock Bad, MD 11/21/2019 12:21 PM

## 2019-11-22 ENCOUNTER — Inpatient Hospital Stay (HOSPITAL_COMMUNITY)
Admission: AD | Admit: 2019-11-22 | Discharge: 2019-11-22 | Disposition: A | Discharge status: Home / Self Care | Payer: Medicaid Other | Attending: Obstetrics and Gynecology | Admitting: Obstetrics and Gynecology

## 2019-11-22 ENCOUNTER — Other Ambulatory Visit: Payer: Self-pay

## 2019-11-22 ENCOUNTER — Encounter (HOSPITAL_COMMUNITY): Payer: Self-pay | Admitting: Obstetrics and Gynecology

## 2019-11-22 DIAGNOSIS — Z3480 Encounter for supervision of other normal pregnancy, unspecified trimester: Secondary | ICD-10-CM

## 2019-11-22 DIAGNOSIS — Z79899 Other long term (current) drug therapy: Secondary | ICD-10-CM | POA: Insufficient documentation

## 2019-11-22 DIAGNOSIS — O26892 Other specified pregnancy related conditions, second trimester: Secondary | ICD-10-CM | POA: Diagnosis not present

## 2019-11-22 DIAGNOSIS — Z87440 Personal history of urinary (tract) infections: Secondary | ICD-10-CM | POA: Diagnosis not present

## 2019-11-22 DIAGNOSIS — A599 Trichomoniasis, unspecified: Secondary | ICD-10-CM

## 2019-11-22 DIAGNOSIS — R102 Pelvic and perineal pain: Secondary | ICD-10-CM | POA: Diagnosis not present

## 2019-11-22 DIAGNOSIS — Z3A18 18 weeks gestation of pregnancy: Secondary | ICD-10-CM | POA: Diagnosis not present

## 2019-11-22 DIAGNOSIS — A749 Chlamydial infection, unspecified: Secondary | ICD-10-CM

## 2019-11-22 LAB — URINALYSIS, ROUTINE W REFLEX MICROSCOPIC
Bilirubin Urine: NEGATIVE
Glucose, UA: NEGATIVE mg/dL
Ketones, ur: NEGATIVE mg/dL
Nitrite: NEGATIVE
Protein, ur: NEGATIVE mg/dL
Specific Gravity, Urine: 1.017 (ref 1.005–1.030)
pH: 7 (ref 5.0–8.0)

## 2019-11-22 LAB — WET PREP, GENITAL
Clue Cells Wet Prep HPF POC: NONE SEEN
Sperm: NONE SEEN
Trich, Wet Prep: NONE SEEN
Yeast Wet Prep HPF POC: NONE SEEN

## 2019-11-22 MED ORDER — COMFORT FIT MATERNITY SUPP MED MISC
0 refills | Status: DC
Start: 2019-11-22 — End: 2020-11-05

## 2019-11-22 MED ORDER — ACETAMINOPHEN 500 MG PO TABS: 1000.0000 mg | ORAL_TABLET | Freq: Once | ORAL | Status: DC

## 2019-11-22 NOTE — MAU Note (Signed)
Pt reports lower abd pressure off/on x one week. Pain is worsening. Hurts the most when she is walking or urinating. Denies fever. Denies bleeding

## 2019-11-22 NOTE — Discharge Instructions (Signed)

## 2019-11-22 NOTE — MAU Provider Note (Signed)
History     CSN: [id number]  Arrival date and time: 11/22/19 1356   First Provider Initiated Contact with Patient 11/22/19 1521      Chief Complaint  Patient presents with   Abdominal Pain   Mariah Leonard is a 24 y.o. G2P1001 at [redacted]w[redacted]d who receives care at [facility].  She presents today for Pelvic Pain.  Patient states that she has been experiencing pelvic pain since last week. However, she states last night at work the pain became so intense that she "couldn't take it anymore."  Patient describes the pain as pressure and states it occurs with walking and urination.  Patient states the pain lasts about 10 minutes then resolves, but will return with further ambulation. Patient rates the pain a 10/10 when it occurs.  Patient endorses fetal movement and denies abdominal cramping.  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  Patient does not wear a belly support belt or band.    OB History    Gravida  2   Para  1   Term  1   Preterm      AB      Living  1     SAB      TAB      Ectopic      Multiple      Live Births  1           Past Medical History:  Diagnosis Date   Infection    UTI    Past Surgical History:  Procedure Laterality Date   NO PAST SURGERIES      History reviewed. No pertinent family history.  Social History   Tobacco Use   Smoking status: Never Smoker   Smokeless tobacco: Never Used  Vaping Use   Vaping Use: Never used  Substance Use Topics   Alcohol use: No   Drug use: No    Allergies: No Known Allergies  Medications Prior to Admission  Medication Sig Dispense Refill Last Dose   ferrous sulfate 325 (65 FE) MG tablet Take 1 tablet (325 mg total) by mouth 2 (two) times daily with a meal. 60 tablet 5 11/21/2019 at Unknown time   ondansetron (ZOFRAN ODT) 4 MG disintegrating tablet Take 1-2 tablets (4-8 mg total) by mouth every 6 (six) hours as needed for  nausea or vomiting. 30 tablet 1 Past Week at Unknown time   Prenat-Fe Poly-Methfol-FA-DHA (VITAFOL ULTRA) 29-0.6-0.4-200 MG CAPS Take 1 capsule by mouth daily before breakfast. 90 capsule 4 11/22/2019 at Unknown time    Review of Systems  Constitutional: Negative for chills and fever.  Gastrointestinal: Negative for abdominal pain, constipation, diarrhea, nausea and vomiting.  Genitourinary: Positive for pelvic pain. Negative for difficulty urinating, dysuria, vaginal bleeding and vaginal discharge.  Neurological: Negative for dizziness, light-headedness and headaches.   Physical Exam   Blood pressure 108/68, pulse 62, temperature 98.6 F (37 C), temperature source Oral, resp. rate 16, height 5\' 5"  (1.651 m), weight 60.6 kg, last menstrual period 07/16/2019, SpO2 99 %, unknown if currently breastfeeding.  Physical Exam Vitals reviewed.  Constitutional:      Appearance: Normal appearance. She is well-developed.  HENT:     Head: Normocephalic and atraumatic.  Eyes:     Conjunctiva/sclera: Conjunctivae normal.  Cardiovascular:     Rate and Rhythm: Normal rate and regular rhythm.     Heart sounds: Normal heart sounds.  Pulmonary:  Effort: Pulmonary effort is normal. No respiratory distress.     Breath sounds: Normal breath sounds.  Abdominal:     General: Bowel sounds are normal.     Palpations: Abdomen is soft.     Comments: Gravid--fundal height appears AGA, Soft, NT   Musculoskeletal:     Cervical back: Normal range of motion.  Skin:    General: Skin is warm and dry.  Neurological:     Mental Status: She is alert and oriented to person, place, and time.  Psychiatric:        Mood and Affect: Mood normal.        Thought Content: Thought content normal.        Judgment: Judgment normal.     MAU Course  Procedures Results for orders placed or performed during the hospital encounter of 11/22/19 (from the past 24 hour(s))  Urinalysis, Routine w reflex microscopic      Status: Abnormal   Collection Time: 11/22/19  2:46 PM  Result Value Ref Range   Color, Urine YELLOW YELLOW   APPearance HAZY (A) CLEAR   Specific Gravity, Urine 1.017 1.005 - 1.030   pH 7.0 5.0 - 8.0   Glucose, UA NEGATIVE NEGATIVE mg/dL   Hgb urine dipstick SMALL (A) NEGATIVE   Bilirubin Urine NEGATIVE NEGATIVE   Ketones, ur NEGATIVE NEGATIVE mg/dL   Protein, ur NEGATIVE NEGATIVE mg/dL   Nitrite NEGATIVE NEGATIVE   Leukocytes,Ua SMALL (A) NEGATIVE   RBC / HPF 0-5 0 - 5 RBC/hpf   WBC, UA 6-10 0 - 5 WBC/hpf   Bacteria, UA MANY (A) NONE SEEN   Squamous Epithelial / LPF 11-20 0 - 5   Mucus PRESENT     MDM Physical Exam Wet Prep, GC/CT UA Script Assessment and Plan  24 year old, G2P1001  SIUP at 49.3weeks Pelvic Pain  -Reviewed POC with patient. -Patient reports that she has to leave for childcare issues. -Provider defers pelvic exam and blind swabs for cultures. -Patient informed results will be sent to her via mychart. -UA returns with many bacteria and small leuks.  Will send for culture. -Discussed usage of maternity belt/band.  Will give script and information for obtaining. -Requests and given letter for missed work today. -Instructed to go home and take tylenol, relax. -Next appt in office tomorrow. -Encouraged to call or return to MAU if symptoms worsen or with the onset of new symptoms. -Discharged to home in stable condition.  Cherre Robins 11/22/2019, 3:21 PM

## 2019-11-23 ENCOUNTER — Telehealth (INDEPENDENT_AMBULATORY_CARE_PROVIDER_SITE_OTHER): Payer: Medicaid Other | Admitting: Obstetrics

## 2019-11-23 ENCOUNTER — Encounter: Payer: Self-pay | Admitting: Obstetrics

## 2019-11-23 ENCOUNTER — Other Ambulatory Visit: Payer: Self-pay

## 2019-11-23 ENCOUNTER — Telehealth: Payer: Self-pay

## 2019-11-23 DIAGNOSIS — D649 Anemia, unspecified: Secondary | ICD-10-CM

## 2019-11-23 DIAGNOSIS — O98312 Other infections with a predominantly sexual mode of transmission complicating pregnancy, second trimester: Secondary | ICD-10-CM

## 2019-11-23 DIAGNOSIS — O99012 Anemia complicating pregnancy, second trimester: Secondary | ICD-10-CM

## 2019-11-23 DIAGNOSIS — O99019 Anemia complicating pregnancy, unspecified trimester: Secondary | ICD-10-CM

## 2019-11-23 DIAGNOSIS — A749 Chlamydial infection, unspecified: Secondary | ICD-10-CM | POA: Diagnosis not present

## 2019-11-23 DIAGNOSIS — Z3480 Encounter for supervision of other normal pregnancy, unspecified trimester: Secondary | ICD-10-CM

## 2019-11-23 DIAGNOSIS — A599 Trichomoniasis, unspecified: Secondary | ICD-10-CM

## 2019-11-23 DIAGNOSIS — Z3A18 18 weeks gestation of pregnancy: Secondary | ICD-10-CM

## 2019-11-23 LAB — GC/CHLAMYDIA PROBE AMP (~~LOC~~) NOT AT ARMC
Chlamydia: POSITIVE — AB
Comment: NEGATIVE
Comment: NORMAL
Neisseria Gonorrhea: NEGATIVE

## 2019-11-23 MED ORDER — AZITHROMYCIN 500 MG PO TABS: 1000.0000 mg | ORAL_TABLET | Freq: Once | ORAL | 0 refills | Status: AC

## 2019-11-23 NOTE — Progress Notes (Signed)
   TELEHEALTH OBSTETRICS VISIT ENCOUNTER NOTE  I connected with Mariah Leonard on 11/23/19 at 11:15 AM EDT by telephone at home and verified that I am speaking with the correct person using two identifiers.   I discussed the limitations, risks, security and privacy concerns of performing an evaluation and management service by telephone and the availability of in person appointments. I also discussed with the patient that there may be a patient responsible charge related to this service. The patient expressed understanding and agreed to proceed.  Subjective:  Mariah Leonard is a 24 y.o. G2P1001 at [redacted]w[redacted]d being followed for ongoing prenatal care.  She is currently monitored for the following issues for this low-risk pregnancy and has VOMITING; Preterm contractions; Premature uterine contractions; Pregnant; NVD (normal vaginal delivery); Chlamydia; Trichomonas infection; and Supervision of other normal pregnancy, antepartum on their problem list.  Patient reports no complaints. Reports fetal movement. Denies any contractions, bleeding or leaking of fluid.   The following portions of the patient's history were reviewed and updated as appropriate: allergies, current medications, past family history, past medical history, past social history, past surgical history and problem list.   Objective:   General:  Alert, oriented and cooperative.   Mental Status: Normal mood and affect perceived. Normal judgment and thought content.  Rest of physical exam deferred due to type of encounter  Assessment and Plan:  Pregnancy: G2P1001 at [redacted]w[redacted]d 1. Supervision of other normal pregnancy, antepartum  2. Chlamydia, treated  3. Trichomonas infection, treated  4. Anemia affecting pregnancy, antepartum - iron Rx   Preterm labor symptoms and general obstetric precautions including but not limited to vaginal bleeding, contractions, leaking of fluid and fetal movement were reviewed in detail with the  patient.  I discussed the assessment and treatment plan with the patient. The patient was provided an opportunity to ask questions and all were answered. The patient agreed with the plan and demonstrated an understanding of the instructions. The patient was advised to call back or seek an in-person office evaluation/go to MAU at [facility] for any urgent or concerning symptoms. Please refer to After Visit Summary for other counseling recommendations.   I provided 10 minutes of non-face-to-face time during this encounter.  Return in about 4 weeks (around 12/21/2019) for Telephone OB.  Future Appointments  Date Time Provider Department Center  12/21/2019  1:45 PM Conan Bowens, MD [facility] None    Coral Ceo, MD Center for [facility], [facility] 11/23/19

## 2019-11-23 NOTE — Telephone Encounter (Signed)
Attempted to notify patient via phone of CT results.  No answer, no message left.  Script and [employer] sent.  Cherre Robins MSN, CNM Advanced Practice Provider, Center for [employer]

## 2019-11-24 LAB — CULTURE, OB URINE: Culture: 50000 — AB

## 2019-12-21 ENCOUNTER — Telehealth (INDEPENDENT_AMBULATORY_CARE_PROVIDER_SITE_OTHER): Payer: Medicaid Other | Admitting: Obstetrics and Gynecology

## 2019-12-21 DIAGNOSIS — Z3480 Encounter for supervision of other normal pregnancy, unspecified trimester: Secondary | ICD-10-CM

## 2019-12-22 NOTE — Progress Notes (Signed)
Pt did not answer for her virtual visit. She will be called to reschedule.

## 2020-01-10 ENCOUNTER — Telehealth (INDEPENDENT_AMBULATORY_CARE_PROVIDER_SITE_OTHER): Payer: Medicaid Other

## 2020-01-10 DIAGNOSIS — O99019 Anemia complicating pregnancy, unspecified trimester: Secondary | ICD-10-CM

## 2020-01-10 DIAGNOSIS — Z3A25 25 weeks gestation of pregnancy: Secondary | ICD-10-CM

## 2020-01-10 DIAGNOSIS — Z3480 Encounter for supervision of other normal pregnancy, unspecified trimester: Secondary | ICD-10-CM

## 2020-01-10 DIAGNOSIS — A749 Chlamydial infection, unspecified: Secondary | ICD-10-CM

## 2020-01-10 NOTE — Patient Instructions (Signed)
Glucose Tolerance Test During Pregnancy Why am I having this test? The glucose tolerance test (GTT) is done to check how your body processes sugar (glucose). This is one of several tests used to diagnose diabetes that develops during pregnancy (gestational diabetes mellitus). Gestational diabetes is a temporary form of diabetes that some women develop during pregnancy. It usually occurs during the second trimester of pregnancy and goes away after delivery. Testing (screening) for gestational diabetes usually occurs between 24 and 28 weeks of pregnancy. You may have the GTT test after having a 1-hour glucose screening test if the results from that test indicate that you may have gestational diabetes. You may also have this test if:  You have a history of gestational diabetes.  You have a history of giving birth to very large babies or have experienced repeated fetal loss (stillbirth).  You have signs and symptoms of diabetes, such as: ? Changes in your vision. ? Tingling or numbness in your hands or feet. ? Changes in hunger, thirst, and urination that are not otherwise explained by your pregnancy. What is being tested? This test measures the amount of glucose in your blood at different times during a period of 3 hours. This indicates how well your body is able to process glucose. What kind of sample is taken?  Blood samples are required for this test. They are usually collected by inserting a needle into a blood vessel. How do I prepare for this test?  For 3 days before your test, eat normally. Have plenty of carbohydrate-rich foods.  Follow instructions from your health care provider about: ? Eating or drinking restrictions on the day of the test. You may be asked to not eat or drink anything other than water (fast) starting 8-10 hours before the test. ? Changing or stopping your regular medicines. Some medicines may interfere with this test. Tell a health care provider about:  All  medicines you are taking, including vitamins, herbs, eye drops, creams, and over-the-counter medicines.  Any blood disorders you have.  Any surgeries you have had.  Any medical conditions you have. What happens during the test? First, your blood glucose will be measured. This is referred to as your fasting blood glucose, since you fasted before the test. Then, you will drink a glucose solution that contains a certain amount of glucose. Your blood glucose will be measured again 1, 2, and 3 hours after drinking the solution. This test takes about 3 hours to complete. You will need to stay at the testing location during this time. During the testing period:  Do not eat or drink anything other than the glucose solution.  Do not exercise.  Do not use any products that contain nicotine or tobacco, such as cigarettes and e-cigarettes. If you need help stopping, ask your health care provider. The testing procedure may vary among health care providers and hospitals. How are the results reported? Your results will be reported as milligrams of glucose per deciliter of blood (mg/dL) or millimoles per liter (mmol/L). Your health care provider will compare your results to normal ranges that were established after testing a large group of people (reference ranges). Reference ranges may vary among labs and hospitals. For this test, common reference ranges are:  Fasting: less than 95-105 mg/dL (5.3-5.8 mmol/L).  1 hour after drinking glucose: less than 180-190 mg/dL (10.0-10.5 mmol/L).  2 hours after drinking glucose: less than 155-165 mg/dL (8.6-9.2 mmol/L).  3 hours after drinking glucose: 140-145 mg/dL (7.8-8.1 mmol/L). What do the   results mean? Results within reference ranges are considered normal, meaning that your glucose levels are well-controlled. If two or more of your blood glucose levels are high, you may be diagnosed with gestational diabetes. If only one level is high, your health care  provider may suggest repeat testing or other tests to confirm a diagnosis. Talk with your health care provider about what your results mean. Questions to ask your health care provider Ask your health care provider, or the department that is doing the test:  When will my results be ready?  How will I get my results?  What are my treatment options?  What other tests do I need?  What are my next steps? Summary  The glucose tolerance test (GTT) is one of several tests used to diagnose diabetes that develops during pregnancy (gestational diabetes mellitus). Gestational diabetes is a temporary form of diabetes that some women develop during pregnancy.  You may have the GTT test after having a 1-hour glucose screening test if the results from that test indicate that you may have gestational diabetes. You may also have this test if you have any symptoms or risk factors for gestational diabetes.  Talk with your health care provider about what your results mean. This information is not intended to replace advice given to you by your health care provider. Make sure you discuss any questions you have with your health care provider. Document Revised: 08/19/2018 Document Reviewed: 12/08/2016 Elsevier Patient Education  2020 Elsevier Inc.  

## 2020-01-10 NOTE — Progress Notes (Signed)
   TELEHEALTH OBSTETRICS VISIT ENCOUNTER NOTE  After inability to connect virtually, I connected with Mariah Leonard on 01/10/20 at 10:35 AM EDT by telephone at home and verified that I am speaking with the correct person using two identifiers.   I discussed the limitations, risks, security and privacy concerns of performing an evaluation and management service by telephone and the availability of in person appointments. I also discussed with the patient that there may be a patient responsible charge related to this service. The patient expressed understanding and agreed to proceed.  Subjective:  Mariah Leonard is a 24 y.o. G2P1001 at [redacted]w[redacted]d being followed for ongoing prenatal care.  She is currently monitored for the following issues for this low-risk pregnancy and has Chlamydia; Trichomonas infection; and Supervision of other normal pregnancy, antepartum on their problem list.  Patient reports no complaints. Reports fetal movement. Denies any contractions, bleeding or leaking of fluid.   The following portions of the patient's history were reviewed and updated as appropriate: allergies, current medications, past family history, past medical history, past social history, past surgical history and problem list.   Objective:   General:  Alert, oriented and cooperative.   Mental Status: Normal mood and affect perceived. Normal judgment and thought content.  Rest of physical exam deferred due to type of encounter  Assessment and Plan:  Pregnancy: G2P1001 at [redacted]w[redacted]d 1. Supervision of other normal pregnancy, antepartum -Reviewed Glucola appt preparation including fasting the night before and morning of.   -Discussed anticipated office time of 2.5-3 hours.  -Reviewed blood draw procedures and labs which also include check of iron level.  -Discussed how results of GTT are handled including diabetic education and BS testing for abnormal results and routine care for normal results.   2.   Chlamydia infection -Hx of CT  infection and treatment in July. -Informed that TOC would be collected at next appt.  3. Anemia affecting pregnancy, antepartum -Patient taking iron supplement every 3 days. -Reassured that research has found iron pills more effective with every other day dosing. -Informed that iron levels would be checked at next visit and if significantly lower iron infusion may be recommended.   4. [redacted] weeks gestation of pregnancy -RTO in 3 weeks for LROB with GTT and TOC    Preterm labor symptoms and general obstetric precautions including but not limited to vaginal bleeding, contractions, leaking of fluid and fetal movement were reviewed in detail with the patient.  I discussed the assessment and treatment plan with the patient. The patient was provided an opportunity to ask questions and all were answered. The patient agreed with the plan and demonstrated an understanding of the instructions. The patient was advised to call back or seek an in-person office evaluation/go to [facility] at [facility] for any urgent or concerning symptoms. Please refer to After Visit Summary for other counseling recommendations.   I provided 5 minutes of non-face-to-face time during this encounter.  Return in about 3 weeks (around 01/31/2020) for LROB with GTT.  No future appointments.  Cherre Robins, CNM [facility] [employer], [facility]

## 2020-01-10 NOTE — Progress Notes (Signed)
Pt. States she is unable to take a blood pressure at this time

## 2020-01-31 ENCOUNTER — Other Ambulatory Visit (HOSPITAL_COMMUNITY)
Admission: RE | Admit: 2020-01-31 | Discharge: 2020-01-31 | Disposition: A | Discharge status: Home / Self Care | Payer: Medicaid Other | Source: Ambulatory Visit | Attending: Obstetrics and Gynecology | Admitting: Obstetrics and Gynecology

## 2020-01-31 ENCOUNTER — Encounter: Payer: Self-pay | Admitting: Obstetrics and Gynecology

## 2020-01-31 ENCOUNTER — Other Ambulatory Visit: Payer: Self-pay

## 2020-01-31 ENCOUNTER — Ambulatory Visit (INDEPENDENT_AMBULATORY_CARE_PROVIDER_SITE_OTHER): Payer: Medicaid Other | Admitting: Obstetrics and Gynecology

## 2020-01-31 ENCOUNTER — Other Ambulatory Visit: Payer: Medicaid Other

## 2020-01-31 VITALS — BP 117/74 | HR 54 | Wt 141.0 lb

## 2020-01-31 DIAGNOSIS — A749 Chlamydial infection, unspecified: Secondary | ICD-10-CM | POA: Insufficient documentation

## 2020-01-31 DIAGNOSIS — Z3480 Encounter for supervision of other normal pregnancy, unspecified trimester: Secondary | ICD-10-CM

## 2020-01-31 NOTE — Patient Instructions (Signed)
Contraception Choices Contraception, also called birth control, refers to methods or devices that prevent pregnancy. Hormonal methods Contraceptive implant  A contraceptive implant is a thin, plastic tube that contains a hormone. It is inserted into the upper part of the arm. It can remain in place for up to 3 years. Progestin-only injections Progestin-only injections are injections of progestin, a synthetic form of the hormone progesterone. They are given every 3 months by a health care provider. Birth control pills  Birth control pills are pills that contain hormones that prevent pregnancy. They must be taken once a day, preferably at the same time each day. Birth control patch  The birth control patch contains hormones that prevent pregnancy. It is placed on the skin and must be changed once a week for three weeks and removed on the fourth week. A prescription is needed to use this method of contraception. Vaginal ring  A vaginal ring contains hormones that prevent pregnancy. It is placed in the vagina for three weeks and removed on the fourth week. After that, the process is repeated with a new ring. A prescription is needed to use this method of contraception. Emergency contraceptive Emergency contraceptives prevent pregnancy after unprotected sex. They come in pill form and can be taken up to 5 days after sex. They work best the sooner they are taken after having sex. Most emergency contraceptives are available without a prescription. This method should not be used as your only form of birth control. Barrier methods Female condom  A female condom is a thin sheath that is worn over the penis during sex. Condoms keep sperm from going inside a woman's body. They can be used with a spermicide to increase their effectiveness. They should be disposed after a single use. Female condom  A female condom is a soft, loose-fitting sheath that is put into the vagina before sex. The condom keeps sperm  from going inside a woman's body. They should be disposed after a single use. Diaphragm  A diaphragm is a soft, dome-shaped barrier. It is inserted into the vagina before sex, along with a spermicide. The diaphragm blocks sperm from entering the uterus, and the spermicide kills sperm. A diaphragm should be left in the vagina for 6-8 hours after sex and removed within 24 hours. A diaphragm is prescribed and fitted by a health care provider. A diaphragm should be replaced every 1-2 years, after giving birth, after gaining more than 15 lb (6.8 kg), and after pelvic surgery. Cervical cap  A cervical cap is a round, soft latex or plastic cup that fits over the cervix. It is inserted into the vagina before sex, along with spermicide. It blocks sperm from entering the uterus. The cap should be left in place for 6-8 hours after sex and removed within 48 hours. A cervical cap must be prescribed and fitted by a health care provider. It should be replaced every 2 years. Sponge  A sponge is a soft, circular piece of polyurethane foam with spermicide on it. The sponge helps block sperm from entering the uterus, and the spermicide kills sperm. To use it, you make it wet and then insert it into the vagina. It should be inserted before sex, left in for at least 6 hours after sex, and removed and thrown away within 30 hours. Spermicides Spermicides are chemicals that kill or block sperm from entering the cervix and uterus. They can come as a cream, jelly, suppository, foam, or tablet. A spermicide should be inserted into the   vagina with an applicator at least 10-15 minutes before sex to allow time for it to work. The process must be repeated every time you have sex. Spermicides do not require a prescription. Intrauterine contraception Intrauterine device (IUD) An IUD is a T-shaped device that is put in a woman's uterus. There are two types:  Hormone IUD.This type contains progestin, a synthetic form of the hormone  progesterone. This type can stay in place for 3-5 years.  Copper IUD.This type is wrapped in copper wire. It can stay in place for 10 years.  Permanent methods of contraception Female tubal ligation In this method, a woman's fallopian tubes are sealed, tied, or blocked during surgery to prevent eggs from traveling to the uterus. Hysteroscopic sterilization In this method, a small, flexible insert is placed into each fallopian tube. The inserts cause scar tissue to form in the fallopian tubes and block them, so sperm cannot reach an egg. The procedure takes about 3 months to be effective. Another form of birth control must be used during those 3 months. Female sterilization This is a procedure to tie off the tubes that carry sperm (vasectomy). After the procedure, the man can still ejaculate fluid (semen). Natural planning methods Natural family planning In this method, a couple does not have sex on days when the woman could become pregnant. Calendar method This means keeping track of the length of each menstrual cycle, identifying the days when pregnancy can happen, and not having sex on those days. Ovulation method In this method, a couple avoids sex during ovulation. Symptothermal method This method involves not having sex during ovulation. The woman typically checks for ovulation by watching changes in her temperature and in the consistency of cervical mucus. Post-ovulation method In this method, a couple waits to have sex until after ovulation. Summary  Contraception, also called birth control, means methods or devices that prevent pregnancy.  Hormonal methods of contraception include implants, injections, pills, patches, vaginal rings, and emergency contraceptives.  Barrier methods of contraception can include female condoms, female condoms, diaphragms, cervical caps, sponges, and spermicides.  There are two types of IUDs (intrauterine devices). An IUD can be put in a woman's uterus to  prevent pregnancy for 3-5 years.  Permanent sterilization can be done through a procedure for males, females, or both.  Natural family planning methods involve not having sex on days when the woman could become pregnant. This information is not intended to replace advice given to you by your health care provider. Make sure you discuss any questions you have with your health care provider. Document Revised: 04/30/2017 Document Reviewed: 05/31/2016 Elsevier Patient Education  2020 Elsevier Inc.  

## 2020-01-31 NOTE — Progress Notes (Signed)
Pt presents for ROB and 2 gtt labs Tdap and Flu vaccine offered; pt declined TOC CT due - Partner was treated per pt

## 2020-01-31 NOTE — Progress Notes (Signed)
   PRENATAL VISIT NOTE  Subjective:  Mariah Leonard is a 24 y.o. G2P1001 at [redacted]w[redacted]d being seen today for ongoing prenatal care.  She is currently monitored for the following issues for this low-risk pregnancy and has Chlamydia; Trichomonas infection; and Supervision of other normal pregnancy, antepartum on their problem list.  Patient reports no complaints.  Contractions: Not present. Vag. Bleeding: None.  Movement: Present. Denies leaking of fluid.   The following portions of the patient's history were reviewed and updated as appropriate: allergies, current medications, past family history, past medical history, past social history, past surgical history and problem list.   Objective:   Vitals:   01/31/20 0957  BP: 117/74  Pulse: (!) 54  Weight: 141 lb (64 kg)    Fetal Status: Fetal Heart Rate (bpm): 130  Fundal Height: 30 cm Movement: Present     General:  Alert, oriented and cooperative. Patient is in no acute distress.  Skin: Skin is warm and dry. No rash noted.   Cardiovascular: Normal heart rate noted  Respiratory: Normal respiratory effort, no problems with respiration noted  Abdomen: Soft, gravid, appropriate for gestational age.  Pain/Pressure: Absent     Pelvic: Cervical exam deferred        Extremities: Normal range of motion.  Edema: None  Mental Status: Normal mood and affect. Normal behavior. Normal judgment and thought content.   Assessment and Plan:  Pregnancy: G2P1001 at [redacted]w[redacted]d 1. Supervision of other normal pregnancy, antepartum Patient is doing well without complaints Third trimester labs and glucola today Patient declined flu and tdap Anatomy ultrasound ordered Contraception options reviewed with the patient - [place] MFM OB COMP + 14 WK; Future - Glucose Tolerance, 2 Hours w/1 Hour - HIV Antibody (routine testing w rflx) - RPR - CBC  2. Chlamydia TOC today - Cervicovaginal ancillary only( [place])  Preterm labor symptoms and general obstetric  precautions including but not limited to vaginal bleeding, contractions, leaking of fluid and fetal movement were reviewed in detail with the patient. Please refer to After Visit Summary for other counseling recommendations.   Return in about 2 weeks (around 02/14/2020) for in person, ROB.  No future appointments.  Catalina Antigua, MD

## 2020-02-01 LAB — CBC
Hematocrit: 33.3 % — ABNORMAL LOW (ref 34.0–46.6)
Hemoglobin: 10.9 g/dL — ABNORMAL LOW (ref 11.1–15.9)
MCH: 28.7 pg (ref 26.6–33.0)
MCHC: 32.7 g/dL (ref 31.5–35.7)
MCV: 88 fL (ref 79–97)
Platelets: 177 10*3/uL (ref 150–450)
RBC: 3.8 x10E6/uL (ref 3.77–5.28)
RDW: 12.2 % (ref 11.7–15.4)
WBC: 6 10*3/uL (ref 3.4–10.8)

## 2020-02-01 LAB — CERVICOVAGINAL ANCILLARY ONLY
Chlamydia: NEGATIVE
Comment: NEGATIVE
Comment: NORMAL
Neisseria Gonorrhea: NEGATIVE

## 2020-02-01 LAB — GLUCOSE TOLERANCE, 2 HOURS W/ 1HR
Glucose, 1 hour: 149 mg/dL (ref 65–179)
Glucose, 2 hour: 128 mg/dL (ref 65–152)
Glucose, Fasting: 80 mg/dL (ref 65–91)

## 2020-02-01 LAB — HIV ANTIBODY (ROUTINE TESTING W REFLEX): HIV Screen 4th Generation wRfx: NONREACTIVE

## 2020-02-01 LAB — RPR: RPR Ser Ql: NONREACTIVE

## 2020-02-14 ENCOUNTER — Ambulatory Visit (INDEPENDENT_AMBULATORY_CARE_PROVIDER_SITE_OTHER): Payer: Medicaid Other | Admitting: Advanced Practice Midwife

## 2020-02-14 ENCOUNTER — Other Ambulatory Visit: Payer: Self-pay

## 2020-02-14 ENCOUNTER — Encounter: Payer: Self-pay | Admitting: Advanced Practice Midwife

## 2020-02-14 VITALS — BP 114/78 | HR 68 | Wt 142.8 lb

## 2020-02-14 DIAGNOSIS — A599 Trichomoniasis, unspecified: Secondary | ICD-10-CM

## 2020-02-14 DIAGNOSIS — A749 Chlamydial infection, unspecified: Secondary | ICD-10-CM

## 2020-02-14 DIAGNOSIS — Z3480 Encounter for supervision of other normal pregnancy, unspecified trimester: Secondary | ICD-10-CM

## 2020-02-14 DIAGNOSIS — Z3A30 30 weeks gestation of pregnancy: Secondary | ICD-10-CM

## 2020-02-14 DIAGNOSIS — O26843 Uterine size-date discrepancy, third trimester: Secondary | ICD-10-CM

## 2020-02-14 NOTE — Progress Notes (Signed)
ROB [redacted]w[redacted]d  CC: None   Pt needs refills on PNV's that will be covered by Ins.

## 2020-02-14 NOTE — Progress Notes (Signed)
° °  PRENATAL VISIT NOTE  Subjective:  Mariah Leonard is a 24 y.o. G2P1001 at [redacted]w[redacted]d being seen today for ongoing prenatal care.  She is currently monitored for the following issues for this low-risk pregnancy and has Chlamydia; Trichomonas infection; and Supervision of other normal pregnancy, antepartum on their problem list.  Patient reports no complaints.  Contractions: Not present. Vag. Bleeding: None.  Movement: Present. Denies leaking of fluid.   The following portions of the patient's history were reviewed and updated as appropriate: allergies, current medications, past family history, past medical history, past social history, past surgical history and problem list.   Objective:   Vitals:   02/14/20 1005  BP: 114/78  Pulse: 68  Weight: 142 lb 12.8 oz (64.8 kg)    Fetal Status: Fetal Heart Rate (bpm): 141   Movement: Present     General:  Alert, oriented and cooperative. Patient is in no acute distress.  Skin: Skin is warm and dry. No rash noted.   Cardiovascular: Normal heart rate noted  Respiratory: Normal respiratory effort, no problems with respiration noted  Abdomen: Soft, gravid, appropriate for gestational age.  Pain/Pressure: Present     Pelvic: Cervical exam deferred        Extremities: Normal range of motion.  Edema: None  Mental Status: Normal mood and affect. Normal behavior. Normal judgment and thought content.   Assessment and Plan:  Pregnancy: G2P1001 at [redacted]w[redacted]d 1. Supervision of other normal pregnancy, antepartum --Anticipatory guidance about next visits/weeks of pregnancy given. --Next visit in 2 weeks, virtual  2. Trichomonas infection --TOC neg  3. Chlamydia --TOC neg  4. [redacted] weeks gestation of pregnancy  5. Uterine size date discrepancy pregnancy, third trimester --Pregnancy dated by sure LMP, no [place] in pregnancy --Anatomy US scheduled 10/12   Preterm labor symptoms and general obstetric precautions including but not limited to vaginal  bleeding, contractions, leaking of fluid and fetal movement were reviewed in detail with the patient. Please refer to After Visit Summary for other counseling recommendations.   No follow-ups on file.  Future Appointments  Date Time Provider Department Center  02/21/2020  8:45 AM [facility] US5 [id number]    Sharen Counter, CNM

## 2020-02-14 NOTE — Patient Instructions (Signed)
Third Trimester of Pregnancy The third trimester is from week 28 through week 40 (months 7 through 9). The third trimester is a time when the unborn baby (fetus) is growing rapidly. At the end of the ninth month, the fetus is about 20 inches in length and weighs 6-10 pounds. Body changes during your third trimester Your body will continue to go through many changes during pregnancy. The changes vary from woman to woman. During the third trimester:  Your weight will continue to increase. You can expect to gain 25-35 pounds (11-16 kg) by the end of the pregnancy.  You may begin to get stretch marks on your hips, abdomen, and breasts.  You may urinate more often because the fetus is moving lower into your pelvis and pressing on your bladder.  You may develop or continue to have heartburn. This is caused by increased hormones that slow down muscles in the digestive tract.  You may develop or continue to have constipation because increased hormones slow digestion and cause the muscles that push waste through your intestines to relax.  You may develop hemorrhoids. These are swollen veins (varicose veins) in the rectum that can itch or be painful.  You may develop swollen, bulging veins (varicose veins) in your legs.  You may have increased body aches in the pelvis, back, or thighs. This is due to weight gain and increased hormones that are relaxing your joints.  You may have changes in your hair. These can include thickening of your hair, rapid growth, and changes in texture. Some women also have hair loss during or after pregnancy, or hair that feels dry or thin. Your hair will most likely return to normal after your baby is born.  Your breasts will continue to grow and they will continue to become tender. A yellow fluid (colostrum) may leak from your breasts. This is the first milk you are producing for your baby.  Your belly button may stick out.  You may notice more swelling in your hands,  face, or ankles.  You may have increased tingling or numbness in your hands, arms, and legs. The skin on your belly may also feel numb.  You may feel short of breath because of your expanding uterus.  You may have more problems sleeping. This can be caused by the size of your belly, increased need to urinate, and an increase in your body's metabolism.  You may notice the fetus "dropping," or moving lower in your abdomen (lightening).  You may have increased vaginal discharge.  You may notice your joints feel loose and you may have pain around your pelvic bone. What to expect at prenatal visits You will have prenatal exams every 2 weeks until week 36. Then you will have weekly prenatal exams. During a routine prenatal visit:  You will be weighed to make sure you and the baby are growing normally.  Your blood pressure will be taken.  Your abdomen will be measured to track your baby's growth.  The fetal heartbeat will be listened to.  Any test results from the previous visit will be discussed.  You may have a cervical check near your due date to see if your cervix has softened or thinned (effaced).  You will be tested for Group B streptococcus. This happens between 35 and 37 weeks. Your health care provider may ask you:  What your birth plan is.  How you are feeling.  If you are feeling the baby move.  If you have had any abnormal   symptoms, such as leaking fluid, bleeding, severe headaches, or abdominal cramping.  If you are using any tobacco products, including cigarettes, chewing tobacco, and electronic cigarettes.  If you have any questions. Other tests or screenings that may be performed during your third trimester include:  Blood tests that check for low iron levels (anemia).  Fetal testing to check the health, activity level, and growth of the fetus. Testing is done if you have certain medical conditions or if there are problems during the pregnancy.  Nonstress test  (NST). This test checks the health of your baby to make sure there are no signs of problems, such as the baby not getting enough oxygen. During this test, a belt is placed around your belly. The baby is made to move, and its heart rate is monitored during movement. What is false labor? False labor is a condition in which you feel small, irregular tightenings of the muscles in the womb (contractions) that usually go away with rest, changing position, or drinking water. These are called Braxton Hicks contractions. Contractions may last for hours, days, or even weeks before true labor sets in. If contractions come at regular intervals, become more frequent, increase in intensity, or become painful, you should see your health care provider. What are the signs of labor?  Abdominal cramps.  Regular contractions that start at 10 minutes apart and become stronger and more frequent with time.  Contractions that start on the top of the uterus and spread down to the lower abdomen and back.  Increased pelvic pressure and dull back pain.  A watery or bloody mucus discharge that comes from the vagina.  Leaking of amniotic fluid. This is also known as your "water breaking." It could be a slow trickle or a gush. Let your health care provider know if it has a color or strange odor. If you have any of these signs, call your health care provider right away, even if it is before your due date. Follow these instructions at home: Medicines  Follow your health care provider's instructions regarding medicine use. Specific medicines may be either safe or unsafe to take during pregnancy.  Take a prenatal vitamin that contains at least 600 micrograms (mcg) of folic acid.  If you develop constipation, try taking a stool softener if your health care provider approves. Eating and drinking   Eat a balanced diet that includes fresh fruits and vegetables, whole grains, good sources of protein such as meat, eggs, or tofu,  and low-fat dairy. Your health care provider will help you determine the amount of weight gain that is right for you.  Avoid raw meat and uncooked cheese. These carry germs that can cause birth defects in the baby.  If you have low calcium intake from food, talk to your health care provider about whether you should take a daily calcium supplement.  Eat four or five small meals rather than three large meals a day.  Limit foods that are high in fat and processed sugars, such as fried and sweet foods.  To prevent constipation: ? Drink enough fluid to keep your urine clear or pale yellow. ? Eat foods that are high in fiber, such as fresh fruits and vegetables, whole grains, and beans. Activity  Exercise only as directed by your health care provider. Most women can continue their usual exercise routine during pregnancy. Try to exercise for 30 minutes at least 5 days a week. Stop exercising if you experience uterine contractions.  Avoid heavy lifting.  Do   not exercise in extreme heat or humidity, or at high altitudes.  Wear low-heel, comfortable shoes.  Practice good posture.  You may continue to have sex unless your health care provider tells you otherwise. Relieving pain and discomfort  Take frequent breaks and rest with your legs elevated if you have leg cramps or low back pain.  Take warm sitz baths to soothe any pain or discomfort caused by hemorrhoids. Use hemorrhoid cream if your health care provider approves.  Wear a good support bra to prevent discomfort from breast tenderness.  If you develop varicose veins: ? Wear support pantyhose or compression stockings as told by your healthcare provider. ? Elevate your feet for 15 minutes, 3-4 times a day. Prenatal care  Write down your questions. Take them to your prenatal visits.  Keep all your prenatal visits as told by your health care provider. This is important. Safety  Wear your seat belt at all times when driving.  Make  a list of emergency phone numbers, including numbers for family, friends, the hospital, and police and fire departments. General instructions  Avoid cat litter boxes and soil used by cats. These carry germs that can cause birth defects in the baby. If you have a cat, ask someone to clean the litter box for you.  Do not travel far distances unless it is absolutely necessary and only with the approval of your health care provider.  Do not use hot tubs, steam rooms, or saunas.  Do not drink alcohol.  Do not use any products that contain nicotine or tobacco, such as cigarettes and e-cigarettes. If you need help quitting, ask your health care provider.  Do not use any medicinal herbs or unprescribed drugs. These chemicals affect the formation and growth of the baby.  Do not douche or use tampons or scented sanitary pads.  Do not cross your legs for long periods of time.  To prepare for the arrival of your baby: ? Take prenatal classes to understand, practice, and ask questions about labor and delivery. ? Make a trial run to the hospital. ? Visit the hospital and tour the maternity area. ? Arrange for maternity or paternity leave through employers. ? Arrange for family and friends to take care of pets while you are in the hospital. ? Purchase a rear-facing car seat and make sure you know how to install it in your car. ? Pack your hospital bag. ? Prepare the baby's nursery. Make sure to remove all pillows and stuffed animals from the baby's crib to prevent suffocation.  Visit your dentist if you have not gone during your pregnancy. Use a soft toothbrush to brush your teeth and be gentle when you floss. Contact a health care provider if:  You are unsure if you are in labor or if your water has broken.  You become dizzy.  You have mild pelvic cramps, pelvic pressure, or nagging pain in your abdominal area.  You have lower back pain.  You have persistent nausea, vomiting, or  diarrhea.  You have an unusual or bad smelling vaginal discharge.  You have pain when you urinate. Get help right away if:  Your water breaks before 37 weeks.  You have regular contractions less than 5 minutes apart before 37 weeks.  You have a fever.  You are leaking fluid from your vagina.  You have spotting or bleeding from your vagina.  You have severe abdominal pain or cramping.  You have rapid weight loss or weight gain.  You have   shortness of breath with chest pain.  You notice sudden or extreme swelling of your face, hands, ankles, feet, or legs.  Your baby makes fewer than 10 movements in 2 hours.  You have severe headaches that do not go away when you take medicine.  You have vision changes. Summary  The third trimester is from week 28 through week 40, months 7 through 9. The third trimester is a time when the unborn baby (fetus) is growing rapidly.  During the third trimester, your discomfort may increase as you and your baby continue to gain weight. You may have abdominal, leg, and back pain, sleeping problems, and an increased need to urinate.  During the third trimester your breasts will keep growing and they will continue to become tender. A yellow fluid (colostrum) may leak from your breasts. This is the first milk you are producing for your baby.  False labor is a condition in which you feel small, irregular tightenings of the muscles in the womb (contractions) that eventually go away. These are called Braxton Hicks contractions. Contractions may last for hours, days, or even weeks before true labor sets in.  Signs of labor can include: abdominal cramps; regular contractions that start at 10 minutes apart and become stronger and more frequent with time; watery or bloody mucus discharge that comes from the vagina; increased pelvic pressure and dull back pain; and leaking of amniotic fluid. This information is not intended to replace advice given to you by your  health care provider. Make sure you discuss any questions you have with your health care provider. Document Revised: 08/19/2018 Document Reviewed: 06/03/2016 Elsevier Patient Education  2020 [employer]  

## 2020-02-21 ENCOUNTER — Ambulatory Visit: Payer: Medicaid Other | Attending: Obstetrics and Gynecology

## 2020-02-21 ENCOUNTER — Other Ambulatory Visit: Payer: Self-pay

## 2020-02-21 DIAGNOSIS — Z3480 Encounter for supervision of other normal pregnancy, unspecified trimester: Secondary | ICD-10-CM | POA: Insufficient documentation

## 2020-02-28 ENCOUNTER — Other Ambulatory Visit: Payer: Self-pay

## 2020-02-28 ENCOUNTER — Telehealth (INDEPENDENT_AMBULATORY_CARE_PROVIDER_SITE_OTHER): Payer: Medicaid Other | Admitting: Advanced Practice Midwife

## 2020-02-28 DIAGNOSIS — Z3480 Encounter for supervision of other normal pregnancy, unspecified trimester: Secondary | ICD-10-CM

## 2020-02-28 DIAGNOSIS — A749 Chlamydial infection, unspecified: Secondary | ICD-10-CM

## 2020-02-28 DIAGNOSIS — A599 Trichomoniasis, unspecified: Secondary | ICD-10-CM

## 2020-02-28 NOTE — Progress Notes (Signed)
No show

## 2020-03-09 ENCOUNTER — Encounter (HOSPITAL_COMMUNITY): Payer: Self-pay | Admitting: Obstetrics & Gynecology

## 2020-03-09 ENCOUNTER — Inpatient Hospital Stay (HOSPITAL_COMMUNITY): Payer: Medicaid Other | Admitting: Anesthesiology

## 2020-03-09 ENCOUNTER — Inpatient Hospital Stay (HOSPITAL_COMMUNITY)
Admission: AD | Admit: 2020-03-09 | Discharge: 2020-03-11 | DRG: 807 | Disposition: A | Discharge status: Home / Self Care | Payer: Medicaid Other | Attending: Obstetrics & Gynecology | Admitting: Obstetrics & Gynecology

## 2020-03-09 ENCOUNTER — Other Ambulatory Visit: Payer: Self-pay

## 2020-03-09 DIAGNOSIS — Z20822 Contact with and (suspected) exposure to covid-19: Secondary | ICD-10-CM | POA: Diagnosis present

## 2020-03-09 DIAGNOSIS — Z3480 Encounter for supervision of other normal pregnancy, unspecified trimester: Secondary | ICD-10-CM

## 2020-03-09 DIAGNOSIS — Z3A38 38 weeks gestation of pregnancy: Secondary | ICD-10-CM | POA: Diagnosis not present

## 2020-03-09 DIAGNOSIS — O139 Gestational [pregnancy-induced] hypertension without significant proteinuria, unspecified trimester: Secondary | ICD-10-CM | POA: Diagnosis not present

## 2020-03-09 DIAGNOSIS — O134 Gestational [pregnancy-induced] hypertension without significant proteinuria, complicating childbirth: Secondary | ICD-10-CM | POA: Diagnosis present

## 2020-03-09 DIAGNOSIS — O26893 Other specified pregnancy related conditions, third trimester: Secondary | ICD-10-CM | POA: Diagnosis present

## 2020-03-09 DIAGNOSIS — A599 Trichomoniasis, unspecified: Secondary | ICD-10-CM

## 2020-03-09 DIAGNOSIS — A749 Chlamydial infection, unspecified: Secondary | ICD-10-CM

## 2020-03-09 LAB — RESPIRATORY PANEL BY RT PCR (FLU A&B, COVID)
Influenza A by PCR: NEGATIVE
Influenza B by PCR: NEGATIVE
SARS Coronavirus 2 by RT PCR: NEGATIVE

## 2020-03-09 LAB — CBC
HCT: 37 % (ref 36.0–46.0)
Hemoglobin: 12.1 g/dL (ref 12.0–15.0)
MCH: 28.8 pg (ref 26.0–34.0)
MCHC: 32.7 g/dL (ref 30.0–36.0)
MCV: 88.1 fL (ref 80.0–100.0)
Platelets: 213 10*3/uL (ref 150–400)
RBC: 4.2 MIL/uL (ref 3.87–5.11)
RDW: 12.9 % (ref 11.5–15.5)
WBC: 8.8 10*3/uL (ref 4.0–10.5)
nRBC: 0 % (ref 0.0–0.2)

## 2020-03-09 LAB — TYPE AND SCREEN
ABO/RH(D): A POS
Antibody Screen: NEGATIVE

## 2020-03-09 MED ORDER — LIDOCAINE HCL (PF) 1 % IJ SOLN: 30.0000 mL | INTRAMUSCULAR | Status: DC | PRN

## 2020-03-09 MED ORDER — EPHEDRINE 5 MG/ML INJ: 10.0000 mg | INTRAVENOUS | Status: DC | PRN

## 2020-03-09 MED ORDER — DIPHENHYDRAMINE HCL 50 MG/ML IJ SOLN: 12.5000 mg | INTRAMUSCULAR | Status: DC | PRN

## 2020-03-09 MED ORDER — IBUPROFEN 800 MG PO TABS
800.0000 mg | ORAL_TABLET | Freq: Three times a day (TID) | ORAL | Status: DC
  Administered 2020-03-09 – 2020-03-11 (×5): 800 mg via ORAL
  Filled 2020-03-09 (×5): qty 1

## 2020-03-09 MED ORDER — LIDOCAINE HCL (PF) 1 % IJ SOLN
INTRAMUSCULAR | Status: DC | PRN
  Administered 2020-03-09: 8 mL via EPIDURAL

## 2020-03-09 MED ORDER — COCONUT OIL OIL: 1.0000 "application " | TOPICAL_OIL | Status: DC | PRN

## 2020-03-09 MED ORDER — TETANUS-DIPHTH-ACELL PERTUSSIS 5-2.5-18.5 LF-MCG/0.5 IM SUSY: 0.5000 mL | PREFILLED_SYRINGE | Freq: Once | INTRAMUSCULAR | Status: DC

## 2020-03-09 MED ORDER — LACTATED RINGERS IV SOLN: INTRAVENOUS | Status: DC

## 2020-03-09 MED ORDER — PRENATAL MULTIVITAMIN CH
1.0000 | ORAL_TABLET | Freq: Every day | ORAL | Status: DC
  Filled 2020-03-09: qty 1

## 2020-03-09 MED ORDER — ACETAMINOPHEN 325 MG PO TABS: 650.0000 mg | ORAL_TABLET | ORAL | Status: DC | PRN

## 2020-03-09 MED ORDER — LACTATED RINGERS IV SOLN: 500.0000 mL | Freq: Once | INTRAVENOUS | Status: DC

## 2020-03-09 MED ORDER — DIPHENHYDRAMINE HCL 25 MG PO CAPS: 25.0000 mg | ORAL_CAPSULE | Freq: Four times a day (QID) | ORAL | Status: DC | PRN

## 2020-03-09 MED ORDER — BENZOCAINE-MENTHOL 20-0.5 % EX AERO
1.0000 "application " | INHALATION_SPRAY | CUTANEOUS | Status: DC | PRN
  Administered 2020-03-10: 1 via TOPICAL
  Filled 2020-03-09: qty 56

## 2020-03-09 MED ORDER — WITCH HAZEL-GLYCERIN EX PADS: 1.0000 "application " | MEDICATED_PAD | CUTANEOUS | Status: DC | PRN

## 2020-03-09 MED ORDER — SENNOSIDES-DOCUSATE SODIUM 8.6-50 MG PO TABS
2.0000 | ORAL_TABLET | ORAL | Status: DC
  Administered 2020-03-10 (×2): 2 via ORAL
  Filled 2020-03-09 (×2): qty 2

## 2020-03-09 MED ORDER — FENTANYL-BUPIVACAINE-NACL 0.5-0.125-0.9 MG/250ML-% EP SOLN
12.0000 mL/h | EPIDURAL | Status: DC | PRN
  Filled 2020-03-09: qty 250

## 2020-03-09 MED ORDER — PHENYLEPHRINE 40 MCG/ML (10ML) SYRINGE FOR IV PUSH (FOR BLOOD PRESSURE SUPPORT)
80.0000 ug | PREFILLED_SYRINGE | INTRAVENOUS | Status: DC | PRN
  Filled 2020-03-09: qty 10

## 2020-03-09 MED ORDER — FLEET ENEMA 7-19 GM/118ML RE ENEM: 1.0000 | ENEMA | RECTAL | Status: DC | PRN

## 2020-03-09 MED ORDER — ONDANSETRON HCL 4 MG/2ML IJ SOLN: 4.0000 mg | INTRAMUSCULAR | Status: DC | PRN

## 2020-03-09 MED ORDER — ONDANSETRON HCL 4 MG PO TABS: 4.0000 mg | ORAL_TABLET | ORAL | Status: DC | PRN

## 2020-03-09 MED ORDER — ONDANSETRON HCL 4 MG/2ML IJ SOLN
4.0000 mg | Freq: Four times a day (QID) | INTRAMUSCULAR | Status: DC | PRN
  Administered 2020-03-09: 4 mg via INTRAVENOUS
  Filled 2020-03-09: qty 2

## 2020-03-09 MED ORDER — OXYTOCIN BOLUS FROM INFUSION
333.0000 mL | Freq: Once | INTRAVENOUS | Status: AC
  Administered 2020-03-09: 333 mL via INTRAVENOUS

## 2020-03-09 MED ORDER — LACTATED RINGERS IV SOLN: 500.0000 mL | INTRAVENOUS | Status: DC | PRN

## 2020-03-09 MED ORDER — OXYCODONE-ACETAMINOPHEN 5-325 MG PO TABS: 1.0000 | ORAL_TABLET | ORAL | Status: DC | PRN

## 2020-03-09 MED ORDER — DIBUCAINE (PERIANAL) 1 % EX OINT: 1.0000 "application " | TOPICAL_OINTMENT | CUTANEOUS | Status: DC | PRN

## 2020-03-09 MED ORDER — SODIUM CHLORIDE (PF) 0.9 % IJ SOLN
INTRAMUSCULAR | Status: DC | PRN
  Administered 2020-03-09: 12 mL/h via EPIDURAL

## 2020-03-09 MED ORDER — ACETAMINOPHEN 500 MG PO TABS
1000.0000 mg | ORAL_TABLET | Freq: Three times a day (TID) | ORAL | Status: DC
  Administered 2020-03-10 – 2020-03-11 (×4): 1000 mg via ORAL
  Filled 2020-03-09 (×5): qty 2

## 2020-03-09 MED ORDER — SIMETHICONE 80 MG PO CHEW: 80.0000 mg | CHEWABLE_TABLET | ORAL | Status: DC | PRN

## 2020-03-09 MED ORDER — OXYCODONE-ACETAMINOPHEN 5-325 MG PO TABS: 2.0000 | ORAL_TABLET | ORAL | Status: DC | PRN

## 2020-03-09 MED ORDER — OXYTOCIN-SODIUM CHLORIDE 30-0.9 UT/500ML-% IV SOLN
2.5000 [IU]/h | INTRAVENOUS | Status: DC
  Filled 2020-03-09: qty 500

## 2020-03-09 MED ORDER — PHENYLEPHRINE 40 MCG/ML (10ML) SYRINGE FOR IV PUSH (FOR BLOOD PRESSURE SUPPORT): 80.0000 ug | PREFILLED_SYRINGE | INTRAVENOUS | Status: DC | PRN

## 2020-03-09 MED ORDER — SOD CITRATE-CITRIC ACID 500-334 MG/5ML PO SOLN: 30.0000 mL | ORAL | Status: DC | PRN

## 2020-03-09 NOTE — Progress Notes (Signed)
Called to bed by RN as patient was visibly upset that she could not go outside and smoke cigarrettes. I sat down with the RN as well and listened to her frustrations and confirmed that she indeed cannot leave the hospital, especially while she has a peripheral IV in, to go smoke tobacco on campus. She was offered nicotine replacement therapy and she adamently declined. She voiced understanding of the hospital tobacco free campus policy.   Kathrin Greathouse, MD PGY-1  03/09/20

## 2020-03-09 NOTE — Social Work (Signed)
CSW received consult for limited PNC.  CSW reviewed chart and is screening out consult as it does not meet criteria for automatic CSW involvement and infant drug screening.  MOB started care prior to 28 weeks and had 3 total visits.  Initial visit June 14, routine visit on September 21 and routine visit on October 5.  Please contact CSW if current concerns arise or by MOB's request.  Manfred Arch, LCSWA Clinical Social Work [employer] and [employer] [phone]

## 2020-03-09 NOTE — Anesthesia Procedure Notes (Signed)
Epidural Patient location during procedure: OB Start time: 03/09/2020 11:05 AM End time: 03/09/2020 11:12 AM  Staffing Anesthesiologist: Mellody Dance, MD Performed: anesthesiologist   Preanesthetic Checklist Completed: patient identified, IV checked, site marked, risks and benefits discussed, monitors and equipment checked, pre-op evaluation and timeout performed  Epidural Patient position: sitting Prep: DuraPrep Patient monitoring: heart rate, cardiac monitor, continuous pulse ox and blood pressure Approach: midline Location: L2-L3 Injection technique: LOR saline  Needle:  Needle type: Tuohy  Needle gauge: 17 G Needle length: 9 cm Needle insertion depth: 4 cm Catheter type: closed end flexible Catheter size: 20 Guage Catheter at skin depth: 9 cm Test dose: negative and Other  Assessment Events: blood not aspirated, injection not painful, no injection resistance and negative IV test  Additional Notes Informed consent obtained prior to proceeding including risk of failure, 1% risk of PDPH, risk of minor discomfort and bruising.  Discussed rare but serious complications including epidural abscess, permanent nerve injury, epidural hematoma.  Discussed alternatives to epidural analgesia and patient desires to proceed.  Timeout performed pre-procedure verifying patient name, procedure, and platelet count.  Patient tolerated procedure well.

## 2020-03-09 NOTE — Anesthesia Preprocedure Evaluation (Signed)
Anesthesia Evaluation  Patient identified by MRN, date of birth, ID band Patient awake    Reviewed: Allergy & Precautions, H&P , NPO status , Patient's Chart, lab work & pertinent test results  Airway Mallampati: I  TM Distance: >3 FB Neck ROM: full    Dental no notable dental hx.    Pulmonary neg pulmonary ROS,    Pulmonary exam normal        Cardiovascular Exercise Tolerance: Good negative cardio ROS Normal cardiovascular exam     Neuro/Psych negative neurological ROS  negative psych ROS   GI/Hepatic negative GI ROS, Neg liver ROS,   Endo/Other  negative endocrine ROS  Renal/GU negative Renal ROS  negative genitourinary   Musculoskeletal   Abdominal Normal abdominal exam  (+)   Peds negative pediatric ROS (+)  Hematology negative hematology ROS (+)   Anesthesia Other Findings   Reproductive/Obstetrics (+) Pregnancy                             Anesthesia Physical  Anesthesia Plan  ASA: II  Anesthesia Plan: Epidural   Post-op Pain Management:    Induction:   PONV Risk Score and Plan: Treatment may vary due to age or medical condition  Airway Management Planned:   Additional Equipment:   Intra-op Plan:   Post-operative Plan:   Informed Consent: I have reviewed the patients History and Physical, chart, labs and discussed the procedure including the risks, benefits and alternatives for the proposed anesthesia with the patient or authorized representative who has indicated his/her understanding and acceptance.       Plan Discussed with: Anesthesiologist  Anesthesia Plan Comments:         Anesthesia Quick Evaluation

## 2020-03-09 NOTE — Discharge Instructions (Signed)

## 2020-03-09 NOTE — Anesthesia Postprocedure Evaluation (Signed)
Anesthesia Post Note  Patient: Mariah Leonard  Procedure(s) Performed: AN AD HOC LABOR EPIDURAL     Patient location during evaluation: Mother Baby Anesthesia Type: Epidural Level of consciousness: awake Pain management: satisfactory to patient Vital Signs Assessment: post-procedure vital signs reviewed and stable Respiratory status: spontaneous breathing Cardiovascular status: stable Anesthetic complications: no   No complications documented.  Last Vitals:  Vitals:   03/09/20 1528 03/09/20 1638  BP: 127/73 126/83  Pulse:  (!) 56  Resp: 18 20  Temp: 37 C 36.8 C  SpO2: 100%     Last Pain:  Vitals:   03/09/20 1638  TempSrc: Oral  PainSc: 0-No pain   Pain Goal:                   [employer]

## 2020-03-09 NOTE — Progress Notes (Signed)
Spoke with MD regarding pt request to go outside and smoke. Aware pt is ambulating and voiding on their own and bleeding is scant. MD stated IV must be removed before leaving unit and it is state law to not smoke on campus. MD okay with patient walking outside as long as pt is aware that IV must be removed and replaced if an emergency occurs and that smoking can not occur on campus. Pt aware of what is stated above and states her understanding. Pt IV removed by RN and pt states she is okay walking outside accompanied by her visitor. Infant to nursery under supervision of RN.

## 2020-03-09 NOTE — Discharge Summary (Addendum)
Postpartum Discharge Summary   Patient Name: Mariah Leonard DOB: 04-11-1996 MRN: [id number]  Date of admission: 03/09/2020 Delivery date:03/09/2020  Delivering provider: [facility], Joneen Caraway C  Date of discharge: 03/11/2020  Admitting diagnosis: Normal labor [O80, Z37.9] Intrauterine pregnancy: [redacted]w[redacted]d     Secondary diagnosis:  Active Problems:   Normal labor   Gestational hypertension  Additional problems: Patient with elevated blood pressure 145/97 on morning of PPD#1 (03/10/20) with RUQ pain. CBC and CMP wnl. Patient was started on Norvasc 5 mg and continued at discharge.  Improvement of RUQ pain and pain only present with cough or sneeze at time of discharge. BP cuff sent to pharmacy at discharge w/ return precautions. 1 week BP follow up.    Discharge diagnosis: Term Pregnancy Delivered                                              Post partum procedures:none Augmentation: none Complications: None  Hospital course: Onset of Labor With Vaginal Delivery      24 y.o. yo G2P1001 at [redacted]w[redacted]d was admitted in Active Labor on 03/09/2020. Patient had an uncomplicated labor course, progressing to SVD without augmentation.  Membrane Rupture Time/Date: 12:23 PM ,03/09/2020   Delivery Method:Vaginal, Spontaneous  Episiotomy: None  Lacerations:  None  Patient had an uncomplicated postpartum course overall. She is ambulating, tolerating a regular diet, passing flatus, and urinating well. Patient is discharged home in stable condition on 03/11/20.  Newborn Data: Birth date:03/09/2020  Birth time:12:38 PM  Gender:Female  Living status:Living  Apgars:9 ,9  Weight:2946 g (6lb 7.9oz)  Magnesium Sulfate received: No BMZ received: No Rhophylac:N/A MMR:N/A T-DaP:declined prenatally Flu: No Transfusion:No  Physical exam  Vitals:   03/10/20 1351 03/10/20 2119 03/11/20 0550 03/11/20 1020  BP: 127/79 135/84 (!) 119/91 140/88  Pulse: (!) 57 (!) 59 68 73  Resp: $Remo'16 16 18 18  'RncSE$ Temp: 99.1 F (37.3 C)  98.8 F (37.1 C) 98.1 F (36.7 C) 98.1 F (36.7 C)  TempSrc: Oral Oral Oral Oral  SpO2:  100% 99% 99%   General: alert, cooperative and no distress Lochia: appropriate Uterine Fundus: firm Incision: N/A DVT Evaluation: No evidence of DVT seen on physical exam. No significant calf/ankle edema. Labs: Lab Results  Component Value Date   WBC 10.3 03/10/2020   HGB 9.8 (L) 03/10/2020   HCT 30.4 (L) 03/10/2020   MCV 87.9 03/10/2020   PLT 168 03/10/2020   CMP Latest Ref Rng & Units 03/10/2020  Glucose 70 - 99 mg/dL 114(H)  BUN 6 - 20 mg/dL 5(L)  Creatinine 0.44 - 1.00 mg/dL 0.76  Sodium 135 - 145 mmol/L 135  Potassium 3.5 - 5.1 mmol/L 3.8  Chloride 98 - 111 mmol/L 103  CO2 22 - 32 mmol/L 23  Calcium 8.9 - 10.3 mg/dL 8.4(L)  Total Protein 6.5 - 8.1 g/dL 5.5(L)  Total Bilirubin 0.3 - 1.2 mg/dL 0.5  Alkaline Phos 38 - 126 U/L 137(H)  AST 15 - 41 U/L 26  ALT 0 - 44 U/L 12   Edinburgh Score: Edinburgh Postnatal Depression Scale Screening Tool 03/09/2020  I have been able to laugh and see the funny side of things. (No Data)     After visit meds:  Allergies as of 03/11/2020   No Known Allergies     Medication List    STOP taking these medications   ondansetron 4  MG disintegrating tablet Commonly known as: Zofran ODT     TAKE these medications   amLODipine 5 MG tablet Commonly known as: NORVASC Take 1 tablet (5 mg total) by mouth daily. Start taking on: March 12, 2020   Blood Pressure Cuff Misc 1 Units by Does not apply route daily.   Comfort Fit Maternity Supp Med Misc Wear belt during the day, as needed, and remove at night prior to bedtime.   ferrous sulfate 325 (65 FE) MG tablet Take 1 tablet (325 mg total) by mouth 2 (two) times daily with a meal.   Vitafol Ultra 29-0.6-0.4-200 MG Caps Take 1 capsule by mouth daily before breakfast.      Discharge home in stable condition Infant Feeding: Bottle and Breast Infant Disposition:home with  mother Discharge instruction: per After Visit Summary and Postpartum booklet. Activity: Advance as tolerated. Pelvic rest for 6 weeks.  Diet: routine diet Future Appointments: Future Appointments  Date Time Provider [place]  03/13/2020 10:00 AM Shelly Bombard, MD [place] None   Follow up Visit:  Myrtis Ser, CNM  P [facility] Please schedule this patient for Postpartum visit in: 4 weeks with the following provider: Any provider  In-Person  For C/S patients schedule nurse incision check in weeks 2 weeks: no  Low risk pregnancy complicated by: none  Delivery mode: SVD  Anticipated Birth Control: other/unsure  PP Procedures needed: none  Schedule Integrated [place] visit: no   03/11/2020 Wilber Oliphant, MD   I saw and evaluated the patient. I agree with the findings and the plan of care as documented in the resident's note. Message sent to clinic to schedule a one week BP check. Patient was discharged on norvasc $RemoveBe'5mg'WpfnpUBeV$ .   Sharene Skeans, MD [facility] Fellow, [facility] for [facility], [place]

## 2020-03-09 NOTE — MAU Note (Signed)
Started contracting this morning, every few minutes. No bleeding or leaking. No recent exam. Threw up in lobby.

## 2020-03-09 NOTE — H&P (Addendum)
OBSTETRIC ADMISSION HISTORY AND PHYSICAL  Mariah Leonard is a 24 y.o. female G2P1001 with IUP at [redacted]w[redacted]d initially by LMP but was redone following U/S at [redacted]w[redacted]d presenting for SOL. She reports +FMs, No LOF, no VB, no blurry vision, headaches or peripheral edema, and RUQ pain.  She plans on bottle feeding. She is currently undecided about PP birth control. She received her prenatal care at [facility]   Dating: By [redacted]w[redacted]d u/s --->  Estimated Date of Delivery: 03/21/20  Sono:    @[redacted]w[redacted]d , CWD, normal anatomy, cephalic presentation,  2994g, 73% EFW   Prenatal History/Complications:  # hx of chlamydia and trichomonas infections during pregnancy with TOC neg on 01/31/2020 #Anatomy 02/02/2020 performed in the third trimester    Past Medical History: Past Medical History:  Diagnosis Date  . Infection    UTI    Past Surgical History: Past Surgical History:  Procedure Laterality Date  . NO PAST SURGERIES      Obstetrical History: OB History    Gravida  2   Para  1   Term  1   Preterm      AB      Living  1     SAB      TAB      Ectopic      Multiple      Live Births  1           Social History Social History   Socioeconomic History  . Marital status: Single    Spouse name: Not on file  . Number of children: Not on file  . Years of education: Not on file  . Highest education level: Not on file  Occupational History  . Not on file  Tobacco Use  . Smoking status: Never Smoker  . Smokeless tobacco: Never Used  Vaping Use  . Vaping Use: Never used  Substance and Sexual Activity  . Alcohol use: No  . Drug use: No  . Sexual activity: Yes    Birth control/protection: None  Other Topics Concern  . Not on file  Social History Narrative  . Not on file   Social Determinants of Health   Financial Resource Strain:   . Difficulty of Paying Living Expenses: Not on file  Food Insecurity:   . Worried About [place] in the Last Year: Not on file  . Ran Out of  Food in the Last Year: Not on file  Transportation Needs:   . Lack of Transportation (Medical): Not on file  . Lack of Transportation (Non-Medical): Not on file  Physical Activity:   . Days of Exercise per Week: Not on file  . Minutes of Exercise per Session: Not on file  Stress:   . Feeling of Stress : Not on file  Social Connections:   . Frequency of Communication with Friends and Family: Not on file  . Frequency of Social Gatherings with Friends and Family: Not on file  . Attends Religious Services: Not on file  . Active Member of Clubs or Organizations: Not on file  . Attends Programme researcher, broadcasting/film/video Meetings: Not on file  . Marital Status: Not on file    Family History: No family history on file.  Allergies: No Known Allergies  Medications Prior to Admission  Medication Sig Dispense Refill Last Dose  . Elastic Bandages & Supports (COMFORT FIT MATERNITY SUPP MED) MISC Wear belt during the day, as needed, and remove at night prior to bedtime. 1 each 0   .  ferrous sulfate 325 (65 FE) MG tablet Take 1 tablet (325 mg total) by mouth 2 (two) times daily with a meal. 60 tablet 5   . ondansetron (ZOFRAN ODT) 4 MG disintegrating tablet Take 1-2 tablets (4-8 mg total) by mouth every 6 (six) hours as needed for nausea or vomiting. (Patient not taking: Reported on 01/31/2020) 30 tablet 1   . Prenat-Fe Poly-Methfol-FA-DHA (VITAFOL ULTRA) 29-0.6-0.4-200 MG CAPS Take 1 capsule by mouth daily before breakfast. 90 capsule 4      Review of Systems   All systems reviewed and negative except as stated in HPI  Last menstrual period 07/16/2019, unknown if currently breastfeeding. General appearance: alert Lungs: upright and breathing normally  Abdomen: soft, non-tender Gravid   Presentation: cephalic Fetal monitoringBaseline: 110-115 bpm, Variability: Good {> 6 bpm), Accelerations: Reactive and Decelerations: Early variables Uterine activityFrequency: regular contractions every 2-3 minutes   Dilation: 6.5 Effacement (%): 80 Exam by:: Rolitta   Prenatal labs: ABO, Rh: A/Positive/-- (06/14 1125) Antibody: Negative (06/14 1125) Rubella: 1.23 (06/14 1125) RPR: Non Reactive (09/21 1041)  HBsAg: Negative (06/14 1125)  HIV: Non Reactive (09/21 1041)  GBS:   no results obtained 1 hr Glucola: FBG: 80; 1hr: 149; 2hr: 128 Genetic screening  Performed and was negative  Anatomy US normal  Prenatal Transfer Tool  Maternal Diabetes: No Genetic Screening: Normal Maternal Ultrasounds/Referrals: Normal Fetal Ultrasounds or other Referrals:  Other: dating by 35.6wk scan Maternal Substance Abuse:  No Significant Maternal Medications:  None Significant Maternal Lab Results: Other:  GBS unknown  No results found for this or any previous visit (from the past 24 hour(s)).  Patient Active Problem List   Diagnosis Date Noted  . Supervision of other normal pregnancy, antepartum 10/20/2019  . Chlamydia 10/17/2019  . Trichomonas infection 10/17/2019    Assessment/Plan:  Mariah Leonard is a 24 y.o. G2P1001 at [redacted]w[redacted]d here for SOL.  #Labor: Progressing naturally  #Pain: Epidural in place  #FWB: Cat 1 #ID:  GBS results not in #MOF: bottle #MOC:undecided, did counseling on this during initial visit.   Earl Gala, Student-PA  03/09/2020, 10:18 AM   CNM attestation:  I have seen and examined this patient; I agree with above documentation in the PA student's note.   Mariah Leonard is a 23 y.o. G2P1001 here for SOL  PE: BP 121/86   Pulse (!) 59   Resp 17   LMP 07/16/2019 (Approximate)   SpO2 98%  Gen: calm comfortable, NAD Resp: normal effort, no distress Abd: gravid  ROS, labs, PMH reviewed  Plan: Admit to Labor and Delivery Expectant management Anticipate vag del  Arabella Merles CNM 03/09/2020, 12:34 PM

## 2020-03-09 NOTE — Progress Notes (Signed)
Dr. Arita Miss and Philipp Deputy notified of inability to find GBS status in chart. Also notified of elevated maternal blood pressures

## 2020-03-10 DIAGNOSIS — O139 Gestational [pregnancy-induced] hypertension without significant proteinuria, unspecified trimester: Secondary | ICD-10-CM | POA: Diagnosis not present

## 2020-03-10 LAB — CBC
HCT: 30.4 % — ABNORMAL LOW (ref 36.0–46.0)
Hemoglobin: 9.8 g/dL — ABNORMAL LOW (ref 12.0–15.0)
MCH: 28.3 pg (ref 26.0–34.0)
MCHC: 32.2 g/dL (ref 30.0–36.0)
MCV: 87.9 fL (ref 80.0–100.0)
Platelets: 168 10*3/uL (ref 150–400)
RBC: 3.46 MIL/uL — ABNORMAL LOW (ref 3.87–5.11)
RDW: 12.9 % (ref 11.5–15.5)
WBC: 10.3 10*3/uL (ref 4.0–10.5)
nRBC: 0 % (ref 0.0–0.2)

## 2020-03-10 LAB — COMPREHENSIVE METABOLIC PANEL
ALT: 12 U/L (ref 0–44)
AST: 26 U/L (ref 15–41)
Albumin: 2.6 g/dL — ABNORMAL LOW (ref 3.5–5.0)
Alkaline Phosphatase: 137 U/L — ABNORMAL HIGH (ref 38–126)
Anion gap: 9 (ref 5–15)
BUN: 5 mg/dL — ABNORMAL LOW (ref 6–20)
CO2: 23 mmol/L (ref 22–32)
Calcium: 8.4 mg/dL — ABNORMAL LOW (ref 8.9–10.3)
Chloride: 103 mmol/L (ref 98–111)
Creatinine, Ser: 0.76 mg/dL (ref 0.44–1.00)
GFR, Estimated: >60 mL/min (ref >60)
Glucose, Bld: 114 mg/dL — ABNORMAL HIGH (ref 70–99)
Potassium: 3.8 mmol/L (ref 3.5–5.1)
Sodium: 135 mmol/L (ref 135–145)
Total Bilirubin: 0.5 mg/dL (ref 0.3–1.2)
Total Protein: 5.5 g/dL — ABNORMAL LOW (ref 6.5–8.1)

## 2020-03-10 LAB — RPR: RPR Ser Ql: NONREACTIVE

## 2020-03-10 MED ORDER — AMLODIPINE BESYLATE 5 MG PO TABS
5.0000 mg | ORAL_TABLET | Freq: Every day | ORAL | Status: DC
  Administered 2020-03-10 – 2020-03-11 (×2): 5 mg via ORAL
  Filled 2020-03-10 (×2): qty 1

## 2020-03-10 MED ORDER — AMLODIPINE BESYLATE 5 MG PO TABS: 5.0000 mg | ORAL_TABLET | Freq: Every day | ORAL | Status: DC

## 2020-03-10 NOTE — Progress Notes (Signed)
RN called physician for patients pain. Physician was in the OR and will return call.

## 2020-03-10 NOTE — Progress Notes (Signed)
Notified MD of pt blood pressure that has increased and pt complaint of right and left upper abdomen pain that pt describes as pressure. Pt states she has no headache or spotty vision and feels okay otherwise. MD aware pt has no IV access. MD called to another room and will call back when able.

## 2020-03-10 NOTE — Progress Notes (Signed)
CSW acknowledged consult for current tobacco use. CSW reviewed notes and identified MOB was offered nicotine replacement by MD, however MOB adamantly declined. CSW does not identify any additional interventions required at this time, as tobacco is a legal substance and MOB has decided to continue use. Please contact CSW if other concerns arise and/or at [facility] request.  CSW identifies no further need for intervention and no barriers to discharge at this time.  Muath Hallam D. Dortha Kern, MSW, LCSW Clinical Social Worker [phone]

## 2020-03-10 NOTE — Progress Notes (Signed)
Postpartum Day 1  Subjective Up ad lib, voiding, tolerating PO, and + flatus. Denies dizziness, lightheadedness, tachycardia. Appropriate Lochia. Pain well controlled.  Objective Temp:  [97.6 F (36.4 C)-98.9 F (37.2 C)] 98.4 F (36.9 C) (10/30 0521) Pulse Rate:  [48-75] 52 (10/30 0521) Resp:  [16-20] 18 (10/30 0521) BP: (111-150)/(66-99) 145/94 (10/30 0521) SpO2:  [98 %-100 %] 100 % (10/30 0521) Intake/Output      10/29 0701 - 10/30 0700 10/30 0701 - 10/31 0700   Urine 150    Blood 500    Total Output 650    Net -650           General: alert, cooperative, NAD Abdomen: tympanic, but non distended  Uterine Fundus: firm MSK: moving extremities spontaneously. Warm and well perfused. No signs of DVT. No BLEE   Recent Labs    03/09/20 1030  HGB 12.1  HCT 37.0   Assessment & Plan Postpartum Day # 1 . Plan for d/c tomorrow  . Pain: scheduled ibuprofen. PRN tylenol & oxycodone . Feed: bottle . Contraception: in office IUD?  Elevated BP  Moderate ranges yesterday PTD. 140's/90's this AM. Patient also complaining of LUQ and RUQ pressure.    CMP, CBC pending  Continue to monitor pressures    LOS: 1 day   Melene Plan 03/10/2020, 7:29 AM

## 2020-03-11 MED ORDER — AMLODIPINE BESYLATE 5 MG PO TABS
5.0000 mg | ORAL_TABLET | Freq: Every day | ORAL | 1 refills | Status: DC
Start: 2020-03-12 — End: 2020-11-05

## 2020-03-11 MED ORDER — BLOOD PRESSURE CUFF MISC: 1.0000 [IU] | Freq: Every day | 0 refills | Status: DC

## 2020-03-12 DIAGNOSIS — Z419 Encounter for procedure for purposes other than remedying health state, unspecified: Secondary | ICD-10-CM | POA: Diagnosis not present

## 2020-03-13 ENCOUNTER — Telehealth: Payer: Self-pay

## 2020-03-13 ENCOUNTER — Encounter: Payer: Medicaid Other | Admitting: Obstetrics

## 2020-03-13 NOTE — Telephone Encounter (Signed)
Patient called requesting to speak with Dr.Harper she report being in a lot of pain. She is having trouble with her gait and it hurts to cough. I advised patient to go back to the hospital since her pain is severe. She got mad at me. I told her I would send a message to the RN for follow up.

## 2020-03-14 ENCOUNTER — Telehealth (INDEPENDENT_AMBULATORY_CARE_PROVIDER_SITE_OTHER): Payer: Medicaid Other | Admitting: Obstetrics

## 2020-03-14 ENCOUNTER — Encounter: Payer: Self-pay | Admitting: Obstetrics

## 2020-03-14 DIAGNOSIS — R300 Dysuria: Secondary | ICD-10-CM

## 2020-03-14 DIAGNOSIS — O99893 Other specified diseases and conditions complicating puerperium: Secondary | ICD-10-CM

## 2020-03-14 NOTE — Progress Notes (Signed)
TELEHEALTH GYNECOLOGY VISIT ENCOUNTER NOTE  I connected with Mariah Leonard on 03/14/20 at  1:30 PM EDT by telephone at home and verified that I am speaking with the correct person using two identifiers.   I discussed the limitations, risks, security and privacy concerns of performing an evaluation and management service by telephone and the availability of in person appointments. I also discussed with the patient that there may be a patient responsible charge related to this service. The patient expressed understanding and agreed to proceed.   History:  Mariah Leonard is a 24 y.o. G70P2002 female being evaluated today for complaint of severe suprapubic and back pain with urination.  She had elevated BP when discharged from hospital on PPD# 2 NSVD, and was started on Norvasc.  PIH labs were normal. She denies any abnormal vaginal discharge, bleeding, or other concerns.       Past Medical History:  Diagnosis Date  . Infection    UTI   Past Surgical History:  Procedure Laterality Date  . NO PAST SURGERIES     The following portions of the patient's history were reviewed and updated as appropriate: allergies, current medications, past family history, past medical history, past social history, past surgical history and problem list.  .   Review of Systems:  Pertinent items noted in HPI and remainder of comprehensive ROS otherwise negative.  Physical Exam:   General:  Alert, oriented and cooperative.   Mental Status: Normal mood and affect perceived. Normal judgment and thought content.  Physical exam deferred due to nature of the encounter  Labs and Imaging Results for orders placed or performed during the hospital encounter of 03/09/20 (from the past 336 hour(s))  Respiratory Panel by RT PCR (Flu A&B, Covid) - Nasopharyngeal Swab   Collection Time: 03/09/20 10:20 AM   Specimen: Nasopharyngeal Swab  Result Value Ref Range   SARS Coronavirus 2 by RT PCR NEGATIVE NEGATIVE    Influenza A by PCR NEGATIVE NEGATIVE   Influenza B by PCR NEGATIVE NEGATIVE  Type and screen [facility]   Collection Time: 03/09/20 10:29 AM  Result Value Ref Range   ABO/RH(D) A POS    Antibody Screen NEG    Sample Expiration      03/12/2020,2359 Performed at [facility], [address] [place], [place] [address]   CBC   Collection Time: 03/09/20 10:30 AM  Result Value Ref Range   WBC 8.8 4.0 - 10.5 K/uL   RBC 4.20 3.87 - 5.11 MIL/uL   Hemoglobin 12.1 12.0 - 15.0 g/dL   HCT 24.0 36 - 46 %   MCV 88.1 80.0 - 100.0 fL   MCH 28.8 26.0 - 34.0 pg   MCHC 32.7 30.0 - 36.0 g/dL   RDW 97.3 53.2 - 99.2 %   Platelets 213 150 - 400 K/uL   nRBC 0.0 0.0 - 0.2 %  RPR   Collection Time: 03/09/20 10:30 AM  Result Value Ref Range   RPR Ser Ql NON REACTIVE NON REACTIVE  CBC   Collection Time: 03/10/20  1:23 PM  Result Value Ref Range   WBC 10.3 4.0 - 10.5 K/uL   RBC 3.46 (L) 3.87 - 5.11 MIL/uL   Hemoglobin 9.8 (L) 12.0 - 15.0 g/dL   HCT 42.6 (L) 36 - 46 %   MCV 87.9 80.0 - 100.0 fL   MCH 28.3 26.0 - 34.0 pg   MCHC 32.2 30.0 - 36.0 g/dL   RDW 12.9  11.5 - 15.5 %   Platelets 168 150 - 400 K/uL   nRBC 0.0 0.0 - 0.2 %  Comprehensive metabolic panel   Collection Time: 03/10/20  1:23 PM  Result Value Ref Range   Sodium 135 135 - 145 mmol/L   Potassium 3.8 3.5 - 5.1 mmol/L   Chloride 103 98 - 111 mmol/L   CO2 23 22 - 32 mmol/L   Glucose, Bld 114 (H) 70 - 99 mg/dL   BUN 5 (L) 6 - 20 mg/dL   Creatinine, Ser 7.16 0.44 - 1.00 mg/dL   Calcium 8.4 (L) 8.9 - 10.3 mg/dL   Total Protein 5.5 (L) 6.5 - 8.1 g/dL   Albumin 2.6 (L) 3.5 - 5.0 g/dL   AST 26 15 - 41 U/L   ALT 12 0 - 44 U/L   Alkaline Phosphatase 137 (H) 38 - 126 U/L   Total Bilirubin 0.5 0.3 - 1.2 mg/dL   GFR, Estimated >96 >78 mL/min   Anion gap 9 5 - 15       Assessment and Plan:     1. Postpartum care following vaginal delivery  2. Dysuria - patient instructed to go to hospital for evaluation  possible severe ascending UTI     I discussed the assessment and treatment plan with the patient. The patient was provided an opportunity to ask questions and all were answered. The patient agreed with the plan and demonstrated an understanding of the instructions.   The patient was advised to call back or seek an in-person evaluation/go to the ED if the symptoms worsen or if the condition fails to improve as anticipated.  I provided 15 minutes of non-face-to-face time during this encounter.   Coral Ceo, MD Center for [facility], [facility] 03/14/20

## 2020-03-16 ENCOUNTER — Ambulatory Visit (INDEPENDENT_AMBULATORY_CARE_PROVIDER_SITE_OTHER): Payer: Medicaid Other

## 2020-03-16 ENCOUNTER — Other Ambulatory Visit: Payer: Self-pay

## 2020-03-16 VITALS — BP 130/80 | HR 90

## 2020-03-16 DIAGNOSIS — R102 Pelvic and perineal pain: Secondary | ICD-10-CM | POA: Diagnosis not present

## 2020-03-16 DIAGNOSIS — R300 Dysuria: Secondary | ICD-10-CM | POA: Diagnosis not present

## 2020-03-16 DIAGNOSIS — Z0130 Encounter for examination of blood pressure without abnormal findings: Secondary | ICD-10-CM

## 2020-03-16 LAB — POCT URINALYSIS DIPSTICK
Bilirubin, UA: NEGATIVE
Blood, UA: NEGATIVE
Glucose, UA: NEGATIVE
Ketones, UA: NEGATIVE
Nitrite, UA: NEGATIVE
Odor: NEGATIVE
Protein, UA: NEGATIVE
Spec Grav, UA: 1.015 (ref 1.010–1.025)
Urobilinogen, UA: 0.2 E.U./dL
pH, UA: 7 (ref 5.0–8.0)

## 2020-03-16 NOTE — Progress Notes (Signed)
Subjective:  Mariah Leonard is a 24 y.o. female here for BP check s/p NSVD on 03/09/20. Pt reports intermittent pain while voiding and severe vaginal pain while walking. OTC pain medication is not working per pt. MD informed pt to report to the hospital after 03/14/20 visit but she didn't go d/t childcare issues.  Hypertension ROS: no TIA's, no chest pain on exertion, no dyspnea on exertion and no swelling of ankles.    Objective:  BP 130/80 P 90 LMP 07/16/2019 (Approximate)   Appearance alert, well appearing, and in no distress. General exam BP noted to be well controlled today in office.    Assessment:   Blood Pressure well controlled.   Plan:  Continue BP meds as directed  Urine culture sent to the lab Consulted with provider, pt to report to the hospital  Keep upcoming routine pp visit.

## 2020-03-19 LAB — URINE CULTURE

## 2020-04-09 ENCOUNTER — Ambulatory Visit: Payer: Medicaid Other | Admitting: Advanced Practice Midwife

## 2020-04-11 DIAGNOSIS — Z419 Encounter for procedure for purposes other than remedying health state, unspecified: Secondary | ICD-10-CM | POA: Diagnosis not present

## 2020-04-17 ENCOUNTER — Ambulatory Visit: Payer: Medicaid Other | Admitting: Obstetrics

## 2020-05-12 DIAGNOSIS — Z419 Encounter for procedure for purposes other than remedying health state, unspecified: Secondary | ICD-10-CM | POA: Diagnosis not present

## 2020-05-15 ENCOUNTER — Other Ambulatory Visit: Payer: Self-pay

## 2020-05-15 ENCOUNTER — Encounter: Payer: Self-pay | Admitting: Obstetrics

## 2020-05-15 ENCOUNTER — Ambulatory Visit (INDEPENDENT_AMBULATORY_CARE_PROVIDER_SITE_OTHER): Payer: Medicaid Other | Admitting: Obstetrics

## 2020-05-15 DIAGNOSIS — Z3009 Encounter for other general counseling and advice on contraception: Secondary | ICD-10-CM

## 2020-05-15 NOTE — Progress Notes (Addendum)
    Post Partum Visit Note  Mariah Leonard is a 25 y.o. G73P2002 female who presents for a postpartum visit. She is 10 weeks postpartum following a normal spontaneous vaginal delivery.  I have fully reviewed the prenatal and intrapartum course. The delivery was at [redacted]w[redacted]d gestational weeks.  Anesthesia: epidural. Postpartum course has been . Baby is doing well. Baby is feeding by bottle - Goodstart Pro. Bleeding no bleeding. Bowel function is normal. Bladder function is normal. Patient is not sexually active. Contraception method is none. Pt requests IUD Mirena next visit. Postpartum depression screening: negative.   The pregnancy intention screening data noted above was reviewed. Potential methods of contraception were discussed. The patient elected to proceed with Abstinence.      The following portions of the patient's history were reviewed and updated as appropriate: allergies, current medications, past family history, past medical history, past social history, past surgical history and problem list.  Review of Systems A comprehensive review of systems was negative.    Objective:  BP 110/72   Pulse (!) 58   Wt 129 lb 11.2 oz (58.8 kg)   LMP 07/16/2019 (Approximate)   BMI 21.58 kg/m    General:  alert and no distress   Breasts:  inspection negative, no nipple discharge or bleeding, no masses or nodularity palpable  Lungs: clear to auscultation bilaterally  Heart:  regular rate and rhythm, S1, S2 normal, no murmur, click, rub or gallop  Abdomen: soft, non-tender; bowel sounds normal; no masses,  no organomegaly    Assessment:    1. Postpartum care following vaginal delivery - doing well  2. Encounter for counseling regarding contraception - wants IUD  Plan:   Follow up in 1 week for IUD insertion  Essential components of care per ACOG recommendations:  1.  Mood and well being: Patient with negative depression screening today.  - Patient does not use tobacco.  - hx  of drug use? No    2. Infant care and feeding:  -Patient currently breastmilk feeding? No  -Social determinants of health (SDOH) reviewed in EPIC. No concerns  3. Sexuality, contraception and birth spacing - Patient does not want a pregnancy in the next year.  Desired family size is 2 children.  - Reviewed forms of contraception in tiered fashion. Patient desired IUD today.   - Discussed birth spacing of 18 months  4. Sleep and fatigue -Encouraged family/partner/community support of 4 hrs of uninterrupted sleep to help with mood and fatigue  5. Physical Recovery  - Discussed patients delivery - Patient had no lacerations  - Patient has urinary incontinence? No - Patient is safe to resume physical and sexual activity  6.  Health Maintenance - Last pap smear done 10/24/2019 and was normal with negative HPV.  7. No Chronic Disease   Coral Ceo, MD Center for [facility], [facility] 05/23/20

## 2020-05-22 ENCOUNTER — Ambulatory Visit: Payer: Medicaid Other | Admitting: Obstetrics

## 2020-05-23 ENCOUNTER — Other Ambulatory Visit: Payer: Self-pay

## 2020-05-23 ENCOUNTER — Encounter: Payer: Self-pay | Admitting: Obstetrics

## 2020-05-23 ENCOUNTER — Emergency Department (HOSPITAL_COMMUNITY)
Admission: EM | Admit: 2020-05-23 | Discharge: 2020-05-23 | Disposition: A | Discharge status: Home / Self Care | Payer: No Typology Code available for payment source | Attending: Emergency Medicine | Admitting: Emergency Medicine

## 2020-05-23 ENCOUNTER — Telehealth (INDEPENDENT_AMBULATORY_CARE_PROVIDER_SITE_OTHER): Payer: Medicaid Other | Admitting: Obstetrics

## 2020-05-23 ENCOUNTER — Encounter (HOSPITAL_COMMUNITY): Payer: Self-pay

## 2020-05-23 DIAGNOSIS — Y92410 Unspecified street and highway as the place of occurrence of the external cause: Secondary | ICD-10-CM | POA: Diagnosis not present

## 2020-05-23 DIAGNOSIS — O99019 Anemia complicating pregnancy, unspecified trimester: Secondary | ICD-10-CM | POA: Diagnosis not present

## 2020-05-23 DIAGNOSIS — M545 Low back pain, unspecified: Secondary | ICD-10-CM | POA: Diagnosis not present

## 2020-05-23 DIAGNOSIS — R109 Unspecified abdominal pain: Secondary | ICD-10-CM | POA: Diagnosis not present

## 2020-05-23 DIAGNOSIS — Z30011 Encounter for initial prescription of contraceptive pills: Secondary | ICD-10-CM

## 2020-05-23 DIAGNOSIS — Z3009 Encounter for other general counseling and advice on contraception: Secondary | ICD-10-CM | POA: Diagnosis not present

## 2020-05-23 DIAGNOSIS — R10819 Abdominal tenderness, unspecified site: Secondary | ICD-10-CM | POA: Diagnosis present

## 2020-05-23 DIAGNOSIS — Z743 Need for continuous supervision: Secondary | ICD-10-CM | POA: Diagnosis not present

## 2020-05-23 DIAGNOSIS — R1084 Generalized abdominal pain: Secondary | ICD-10-CM | POA: Diagnosis not present

## 2020-05-23 MED ORDER — VITAFOL ULTRA 29-0.6-0.4-200 MG PO CAPS: 1.0000 | ORAL_CAPSULE | Freq: Every day | ORAL | 4 refills | Status: DC

## 2020-05-23 MED ORDER — NORETHIN ACE-ETH ESTRAD-FE 1-20 MG-MCG(24) PO TABS: 1.0000 | ORAL_TABLET | Freq: Every day | ORAL | 11 refills | Status: DC

## 2020-05-23 MED ORDER — FERROUS SULFATE 325 (65 FE) MG PO TABS: 325.0000 mg | ORAL_TABLET | ORAL | 5 refills | Status: DC
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
## 2020-05-23 NOTE — Progress Notes (Signed)
   TELEHEALTH GYNECOLOGY VISIT ENCOUNTER NOTE  Provider location: Center for [employer] at [place]   I connected with Mariah Leonard on 05/23/20 at  9:45 AM EST by telephone at home and verified that I am speaking with the correct person using two identifiers. Patient was unable to do MyChart audiovisual encounter due to technical difficulties, she tried several times.   Provider at [facility],  Dr. Clearance Coots, spoke with patient, Clabe Seal, at home, by telephone.   I discussed the limitations, risks, security and privacy concerns of performing an evaluation and management service by telephone and the availability of in person appointments. I also discussed with the patient that there may be a patient responsible charge related to this service. The patient expressed understanding and agreed to proceed.   History:  Mariah Leonard is a 25 y.o. G79P2002 female being evaluated today for contraceptive counseling. She denies any abnormal vaginal discharge, bleeding, pelvic pain or other concerns.       Past Medical History:  Diagnosis Date  . Infection    UTI   Past Surgical History:  Procedure Laterality Date  . NO PAST SURGERIES     The following portions of the patient's history were reviewed and updated as appropriate: allergies, current medications, past family history, past medical history, past social history, past surgical history and problem list.   Health Maintenance:  Normal pap and negative HRHPV on 10-24-19.    Review of Systems:  Pertinent items noted in HPI and remainder of comprehensive ROS otherwise negative.  Physical Exam:   General:  Alert, oriented and cooperative.   Mental Status: Normal mood and affect perceived. Normal judgment and thought content.  Physical exam deferred due to nature of the encounter  Labs and Imaging No results found for this or any previous visit (from the past 336 hour(s)). No results found.    Assessment and Plan:      1. Encounter for counseling regarding contraception  2. Encounter for initial prescription of contraceptive pills Rx: - Norethindrone Acetate-Ethinyl Estrad-FE (LOESTRIN 24 FE) 1-20 MG-MCG(24) tablet; Take 1 tablet by mouth daily.  Dispense: 28 tablet; Refill: 11  3. Anemia affecting pregnancy, antepartum Rx: - ferrous sulfate 325 (65 FE) MG tablet; Take 1 tablet (325 mg total) by mouth every other day.  Dispense: 60 tablet; Refill: 5       I discussed the assessment and treatment plan with the patient. The patient was provided an opportunity to ask questions and all were answered. The patient agreed with the plan and demonstrated an understanding of the instructions.   The patient was advised to call back or seek an in-person evaluation/go to the ED if the symptoms worsen or if the condition fails to improve as anticipated.  I provided 15 minutes of non-face-to-face time during this encounter.   Coral Ceo, MD Center for [facility], [facility] 05/23/20

## 2020-05-23 NOTE — ED Notes (Signed)
Pt ambulated out of ER with steady gait with her baby.

## 2020-05-23 NOTE — Discharge Instructions (Addendum)
Return for worsening pain, vomiting, blood in the stools or new concerns. Use Tylenol and Motrin as needed for pain, ice and heat packs as needed.
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
## 2020-05-23 NOTE — ED Notes (Signed)
Pt discharged to home and instructed to follow up with primary care as needed. Pt verbalized understanding of written and verbal discharge instructions provided and all questions addressed.

## 2020-05-24 ENCOUNTER — Emergency Department (HOSPITAL_COMMUNITY): Payer: No Typology Code available for payment source

## 2020-05-24 ENCOUNTER — Encounter (HOSPITAL_COMMUNITY): Payer: Self-pay | Admitting: Emergency Medicine

## 2020-05-24 ENCOUNTER — Emergency Department (HOSPITAL_COMMUNITY)
Admission: EM | Admit: 2020-05-24 | Discharge: 2020-05-24 | Disposition: A | Discharge status: Home / Self Care | Payer: No Typology Code available for payment source | Attending: Emergency Medicine | Admitting: Emergency Medicine

## 2020-05-24 ENCOUNTER — Other Ambulatory Visit: Payer: Self-pay

## 2020-05-24 DIAGNOSIS — S86911A Strain of unspecified muscle(s) and tendon(s) at lower leg level, right leg, initial encounter: Secondary | ICD-10-CM | POA: Insufficient documentation

## 2020-05-24 DIAGNOSIS — S80911A Unspecified superficial injury of right knee, initial encounter: Secondary | ICD-10-CM | POA: Diagnosis present

## 2020-05-24 DIAGNOSIS — M25561 Pain in right knee: Secondary | ICD-10-CM | POA: Diagnosis not present
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
## 2020-05-24 NOTE — Discharge Instructions (Signed)
Please return for any problem.   Xray today did not reveal any break in the bones of your right knee.

## 2020-06-12 DIAGNOSIS — Z419 Encounter for procedure for purposes other than remedying health state, unspecified: Secondary | ICD-10-CM | POA: Diagnosis not present

## 2020-07-10 DIAGNOSIS — Z419 Encounter for procedure for purposes other than remedying health state, unspecified: Secondary | ICD-10-CM | POA: Diagnosis not present

## 2020-08-10 DIAGNOSIS — Z419 Encounter for procedure for purposes other than remedying health state, unspecified: Secondary | ICD-10-CM | POA: Diagnosis not present

## 2020-08-14 DIAGNOSIS — Z5181 Encounter for therapeutic drug level monitoring: Secondary | ICD-10-CM | POA: Diagnosis not present

## 2020-08-28 DIAGNOSIS — Z5181 Encounter for therapeutic drug level monitoring: Secondary | ICD-10-CM | POA: Diagnosis not present

## 2020-09-09 DIAGNOSIS — Z419 Encounter for procedure for purposes other than remedying health state, unspecified: Secondary | ICD-10-CM | POA: Diagnosis not present

## 2020-09-11 DIAGNOSIS — Z5181 Encounter for therapeutic drug level monitoring: Secondary | ICD-10-CM | POA: Diagnosis not present

## 2020-09-25 DIAGNOSIS — Z5181 Encounter for therapeutic drug level monitoring: Secondary | ICD-10-CM | POA: Diagnosis not present

## 2020-10-10 DIAGNOSIS — Z419 Encounter for procedure for purposes other than remedying health state, unspecified: Secondary | ICD-10-CM | POA: Diagnosis not present

## 2020-10-17 DIAGNOSIS — Z5181 Encounter for therapeutic drug level monitoring: Secondary | ICD-10-CM | POA: Diagnosis not present

## 2020-10-26 ENCOUNTER — Ambulatory Visit (INDEPENDENT_AMBULATORY_CARE_PROVIDER_SITE_OTHER): Payer: Medicaid Other

## 2020-10-26 ENCOUNTER — Other Ambulatory Visit: Payer: Self-pay

## 2020-10-26 DIAGNOSIS — Z3480 Encounter for supervision of other normal pregnancy, unspecified trimester: Secondary | ICD-10-CM

## 2020-10-26 DIAGNOSIS — N912 Amenorrhea, unspecified: Secondary | ICD-10-CM

## 2020-10-26 DIAGNOSIS — Z3201 Encounter for pregnancy test, result positive: Secondary | ICD-10-CM | POA: Diagnosis not present

## 2020-10-26 LAB — POCT URINE PREGNANCY: Preg Test, Ur: POSITIVE — AB

## 2020-10-26 MED ORDER — VITAFOL GUMMIES 3.33-0.333-34.8 MG PO CHEW: 1.0000 | CHEWABLE_TABLET | Freq: Every day | ORAL | 11 refills | Status: DC

## 2020-10-26 NOTE — Progress Notes (Signed)
[name] presents today for UPT. She has no unusual complaints. Requests pregnancy verification letter for work.  LMP: 07/28/20    OBJECTIVE: Appears well, in no apparent distress.  OB History     Gravida  3   Para  2   Term  2   Preterm      AB      Living  2      SAB      IAB      Ectopic      Multiple  0   Live Births  2          Home UPT Result: positive  In-Office UPT result:positive  I have reviewed the patient's medical, obstetrical, social, and family histories, and medications.   ASSESSMENT: Positive pregnancy test  PLAN Prenatal care to be completed at:  [facility] at [facility] PNV's - gummies sent to the pharmacy  Pregnancy verification letter given

## 2020-10-26 NOTE — Progress Notes (Signed)
Patient was assessed and managed by nursing staff during this encounter. I have reviewed the chart and agree with the documentation and plan. I have also made any necessary editorial changes.  Catalina Antigua, MD 10/26/2020 10:10 AM

## 2020-10-30 DIAGNOSIS — Z5181 Encounter for therapeutic drug level monitoring: Secondary | ICD-10-CM | POA: Diagnosis not present

## 2020-11-05 ENCOUNTER — Other Ambulatory Visit: Payer: Self-pay

## 2020-11-05 ENCOUNTER — Ambulatory Visit (INDEPENDENT_AMBULATORY_CARE_PROVIDER_SITE_OTHER): Payer: Medicaid Other

## 2020-11-05 VITALS — BP 123/66 | HR 73 | Ht 65.0 in | Wt 125.9 lb

## 2020-11-05 DIAGNOSIS — Z3A00 Weeks of gestation of pregnancy not specified: Secondary | ICD-10-CM

## 2020-11-05 DIAGNOSIS — Z3481 Encounter for supervision of other normal pregnancy, first trimester: Secondary | ICD-10-CM

## 2020-11-05 DIAGNOSIS — Z3A01 Less than 8 weeks gestation of pregnancy: Secondary | ICD-10-CM

## 2020-11-05 DIAGNOSIS — O021 Missed abortion: Secondary | ICD-10-CM | POA: Diagnosis not present

## 2020-11-05 DIAGNOSIS — O3680X0 Pregnancy with inconclusive fetal viability, not applicable or unspecified: Secondary | ICD-10-CM

## 2020-11-05 DIAGNOSIS — Z3491 Encounter for supervision of normal pregnancy, unspecified, first trimester: Secondary | ICD-10-CM | POA: Insufficient documentation

## 2020-11-05 MED ORDER — GOJJI WEIGHT SCALE MISC: 1.0000 | 0 refills | Status: DC

## 2020-11-05 NOTE — Progress Notes (Signed)
New OB Intake  I connected with  Mariah Leonard on 11/05/20 at 10:15 AM EDT by in person. Video Visit and verified that I am speaking with the correct person using two identifiers. Nurse is located at [facility] and pt is located at [place].  I discussed the limitations, risks, security and privacy concerns of performing an evaluation and management service by telephone and the availability of in person appointments. I also discussed with the patient that there may be a patient responsible charge related to this service. The patient expressed understanding and agreed to proceed.  I explained I am completing New OB Intake today. EDD undecided at this time due to early u/s revealing just an early gestational sac, no yolk sac or fetal pole seen at this time. Plan at this time is to due beta hcg today repeating in 48 hours. Also plan for repeat u/s in 1 week per provider. Pt is G3/P2002. I reviewed her allergies, medications, Medical/Surgical/OB history, and appropriate screenings. I informed her of [facility] services. Based on history, this is a/an  pregnancy uncomplicated .   Patient Active Problem List   Diagnosis Date Noted   Gestational hypertension 03/10/2020   Normal labor 03/09/2020   Supervision of other normal pregnancy, antepartum 10/20/2019   Chlamydia 10/17/2019   Trichomonas infection 10/17/2019    Concerns addressed today  Delivery Plans:  Plans to deliver at [facility]   MyChart/Babyscripts MyChart access verified. I explained pt will have some visits in office and some virtually. Babyscripts instructions given and order placed. Patient verifies receipt of registration text/e-mail. Account successfully created and app downloaded.  Blood Pressure Cuff  Patient has BP cuff at home from last pregnancy. Explained after first prenatal appt pt will check weekly and document in Babyscripts.  Weight scale: Patient does not have weight scale. Weight scale ordered   Anatomy US Explained  first scheduled [place] will be around 19 weeks. Dating and viability performed today.  Labs Discussed Avelina Laine genetic screening with patient. Would like both Panorama and Horizon drawn at new OB visit. Routine prenatal labs needed.  Covid Vaccine Patient has not covid vaccine.    Inform patient of [facility] . In AVS   Social Determinants of Health Food Insecurity: Patient denies food insecurity. WIC Referral: Patient is interested in referral to [facility].  Transportation: Patient denies transportation needs. Childcare: Discussed no children allowed at ultrasound appointments. Offered childcare services; patient declines childcare services at this time.  First visit review I reviewed new OB appt with pt. I explained she will have a pelvic exam, ob bloodwork with genetic screening, and PAP smear. Explained pt will be seen by Coral Ceo at first visit; encounter routed to appropriate provider. Explained that patient will be seen by pregnancy navigator following visit with provider. [facility] information placed in AVS.   Hamilton Capri, RN 11/05/2020  10:24 AM

## 2020-11-05 NOTE — Progress Notes (Signed)
Patient was assessed and managed by nursing staff during this encounter. I have reviewed the chart and agree with the documentation and plan. I have also made any necessary editorial changes.  Spoke with pt and spouse personally.  Pt thought she was 15 weeks.  Explained that scan looks very early and there is some concern fo missed miscarriage or blighted ovum.  Will check bhcg rise in 48  hours and repeat u/s in 1 week.  If findings are not supportive for viable pregnancy will discuss treatment options.  Warden Fillers, MD 11/05/2020 2:47 PM

## 2020-11-06 LAB — BETA HCG QUANT (REF LAB): hCG Quant: 35932 m[IU]/mL

## 2020-11-07 ENCOUNTER — Other Ambulatory Visit: Payer: Medicaid Other

## 2020-11-07 ENCOUNTER — Other Ambulatory Visit: Payer: Self-pay

## 2020-11-07 DIAGNOSIS — O021 Missed abortion: Secondary | ICD-10-CM | POA: Diagnosis not present

## 2020-11-07 DIAGNOSIS — O3680X0 Pregnancy with inconclusive fetal viability, not applicable or unspecified: Secondary | ICD-10-CM

## 2020-11-08 LAB — BETA HCG QUANT (REF LAB): hCG Quant: 31269 m[IU]/mL

## 2020-11-09 ENCOUNTER — Telehealth: Payer: Self-pay

## 2020-11-09 DIAGNOSIS — Z419 Encounter for procedure for purposes other than remedying health state, unspecified: Secondary | ICD-10-CM | POA: Diagnosis not present

## 2020-11-09 NOTE — Telephone Encounter (Signed)
TC to pt regarding results as advised by Dr.Bass. Pt made aware HCG decreased  Pt advised to keep U/S appt as directed Pt agreeable and voiced understanding.

## 2020-11-14 ENCOUNTER — Ambulatory Visit: Admission: RE | Admit: 2020-11-14 | Payer: No Typology Code available for payment source | Source: Ambulatory Visit

## 2020-11-14 ENCOUNTER — Other Ambulatory Visit: Payer: Self-pay

## 2020-11-14 ENCOUNTER — Encounter: Payer: Medicaid Other | Admitting: Obstetrics

## 2020-11-15 ENCOUNTER — Inpatient Hospital Stay: Admission: RE | Admit: 2020-11-15 | Payer: Medicaid Other | Source: Ambulatory Visit

## 2020-11-22 ENCOUNTER — Other Ambulatory Visit: Payer: Medicaid Other

## 2021-03-12 ENCOUNTER — Other Ambulatory Visit: Payer: Self-pay

## 2021-03-12 ENCOUNTER — Ambulatory Visit
Admission: EM | Admit: 2021-03-12 | Discharge: 2021-03-12 | Disposition: A | Discharge status: Home / Self Care | Payer: Medicaid Other | Attending: Physician Assistant | Admitting: Physician Assistant

## 2021-03-12 ENCOUNTER — Encounter: Payer: Self-pay | Admitting: Emergency Medicine

## 2021-03-12 DIAGNOSIS — S90852A Superficial foreign body, left foot, initial encounter: Secondary | ICD-10-CM

## 2021-03-12 MED ORDER — TETANUS-DIPHTH-ACELL PERTUSSIS 5-2.5-18.5 LF-MCG/0.5 IM SUSY
0.5000 mL | PREFILLED_SYRINGE | Freq: Once | INTRAMUSCULAR | Status: AC
  Administered 2021-03-12: 0.5 mL via INTRAMUSCULAR

## 2021-03-12 NOTE — ED Triage Notes (Signed)
Stepped on glass with left foot yesterday morning, does not believe she was able to remove all of it completely.
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
## 2021-03-12 NOTE — Discharge Instructions (Signed)
Keep area clean Return if you develop new symptoms

## 2022-01-10 ENCOUNTER — Emergency Department (HOSPITAL_COMMUNITY)
Admission: EM | Admit: 2022-01-10 | Discharge: 2022-01-11 | Disposition: A | Discharge status: Home / Self Care | Payer: Medicaid Other | Attending: Emergency Medicine | Admitting: Emergency Medicine

## 2022-01-10 ENCOUNTER — Encounter (HOSPITAL_COMMUNITY): Payer: Self-pay | Admitting: Emergency Medicine

## 2022-01-10 DIAGNOSIS — E876 Hypokalemia: Secondary | ICD-10-CM | POA: Insufficient documentation

## 2022-01-10 DIAGNOSIS — T50901A Poisoning by unspecified drugs, medicaments and biological substances, accidental (unintentional), initial encounter: Secondary | ICD-10-CM | POA: Diagnosis present

## 2022-01-10 DIAGNOSIS — R7989 Other specified abnormal findings of blood chemistry: Secondary | ICD-10-CM | POA: Diagnosis not present

## 2022-01-10 DIAGNOSIS — R464 Slowness and poor responsiveness: Secondary | ICD-10-CM | POA: Insufficient documentation

## 2022-01-10 DIAGNOSIS — R5383 Other fatigue: Secondary | ICD-10-CM | POA: Insufficient documentation

## 2022-01-10 DIAGNOSIS — D649 Anemia, unspecified: Secondary | ICD-10-CM | POA: Diagnosis not present

## 2022-01-10 LAB — CBG MONITORING, ED: Glucose-Capillary: 264 mg/dL — ABNORMAL HIGH (ref 70–99)
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
## 2022-01-10 NOTE — ED Provider Notes (Signed)
[facility]-EMERGENCY DEPT Provider Note   CSN: [id number] Arrival date & time: 01/10/22  2307     History {Add pertinent medical, surgical, social history, OB history to HPI:1} Chief Complaint  Patient presents with   Drug Overdose    Mariah Leonard is a 26 y.o. female who presents with EMS after apparent overdose.  Patient states she was waiting to pick up a friend in a parking lot and someone she knows offered her "powder", cocaine which she snorted.  She is evidently became unresponsive, her friend called 911.  She is unresponsive and agonal he breathing with pinpoint pupils upon EMS arrival.  She nasopharyngeal airway placed and was bagged for 5 minutes and administered 1 mg of Narcan with arousal and increased respirations spontaneously.  At this time she endorses feeling tired but otherwise states she is feeling well.  Denies any history of opiate use.  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  I personally reviewed this patient's medical records.  She is history of gestational hypertension, STI.  She is not on any medications daily.  HPI     Home Medications Prior to Admission medications   Medication Sig Start Date End Date Taking? Authorizing Provider  Blood Pressure Monitoring (BLOOD PRESSURE CUFF) MISC 1 Units by Does not apply route daily. 03/11/20   Melene Plan, MD  Misc. Devices (GOJJI WEIGHT SCALE) MISC 1 Device by Does not apply route every 30 (thirty) days. 11/05/20   Warden Fillers, MD  Prenatal Vit-Fe Phos-FA-Omega (VITAFOL GUMMIES) 3.33-0.333-34.8 MG CHEW Chew 1 tablet by mouth daily. 10/26/20   Constant, Peggy, MD      Allergies    Patient has no known allergies.    Review of Systems   Review of Systems  Unable to perform ROS: Acuity of condition  Constitutional:  Positive for fatigue.    Physical Exam Updated Vital Signs There were no vitals taken for this  visit. Physical Exam Vitals and nursing note reviewed.  Constitutional:      Appearance: She is not toxic-appearing.  HENT:     Head: Normocephalic and atraumatic.     Nose: Nose normal.     Mouth/Throat:     Mouth: Mucous membranes are moist.     Pharynx: No oropharyngeal exudate or posterior oropharyngeal erythema.  Eyes:     General: Lids are normal. Vision grossly intact.        Right eye: No discharge.        Left eye: No discharge.     Extraocular Movements: Extraocular movements intact.     Conjunctiva/sclera: Conjunctivae normal.     Pupils: Pupils are equal, round, and reactive to light.     Comments: Pinpoint pupils bilaterally  Cardiovascular:     Rate and Rhythm: Normal rate and regular rhythm.     Pulses: Normal pulses.     Heart sounds: Normal heart sounds. No murmur heard. Pulmonary:     Effort: Pulmonary effort is normal. No respiratory distress.     Breath sounds: Normal breath sounds. No wheezing or rales.  Abdominal:     General: Bowel sounds are normal. There is no distension.     Palpations: Abdomen is soft.     Tenderness: There is no abdominal tenderness. There is no guarding or rebound.  Musculoskeletal:        General: No deformity.     Cervical back: Neck supple.  Right lower leg: No edema.     Left lower leg: No edema.  Skin:    General: Skin is warm and dry.     Capillary Refill: Capillary refill takes less than 2 seconds.  Neurological:     General: No focal deficit present.     Mental Status: She is alert and oriented to person, place, and time. Mental status is at baseline.     GCS: GCS eye subscore is 3. GCS verbal subscore is 5. GCS motor subscore is 6.     Comments: Moving all extremities spontaneously and without difficulty.  Psychiatric:        Mood and Affect: Mood normal.     ED Results / Procedures / Treatments   Labs (all labs ordered are listed, but only abnormal results are displayed) Labs Reviewed  CBG MONITORING, ED -  Abnormal; Notable for the following components:      Result Value   Glucose-Capillary 264 (*)    All other components within normal limits  COMPREHENSIVE METABOLIC PANEL  SALICYLATE LEVEL  ACETAMINOPHEN LEVEL  ETHANOL  RAPID URINE DRUG SCREEN, HOSP PERFORMED  CBC WITH DIFFERENTIAL/PLATELET  I-STAT BETA HCG BLOOD, ED ([facility], [facility], [facility] ONLY)  TROPONIN I (HIGH SENSITIVITY)    EKG None  Radiology No results found.  Procedures Procedures  {Document cardiac monitor, telemetry assessment procedure when appropriate:1}  Medications Ordered in ED Medications - No data to display  ED Course/ Medical Decision Making/ A&P                           Medical Decision Making 26 year old female who presents after resuscitation with BVM and narcan in the field.  VS normal on intake, cardiopulmonary exam is significant for some rhonchi in the right base. Abdominal exam is benign. Pinpoint pupils bilaterally. A&O x 3.  Amount and/or Complexity of Data Reviewed Labs: ordered.   ***  {Document critical care time when appropriate:1} {Document review of labs and clinical decision tools ie heart score, Chads2Vasc2 etc:1}  {Document your independent review of radiology images, and any outside records:1} {Document your discussion with family members, caretakers, and with consultants:1} {Document social determinants of health affecting pt's care:1} {Document your decision making why or why not admission, treatments were needed:1} Final Clinical Impression(s) / ED Diagnoses Final diagnoses:  None    Rx / DC Orders ED Discharge Orders     None

## 2022-01-11 LAB — CBC WITH DIFFERENTIAL/PLATELET
Abs Immature Granulocytes: 0.09 10*3/uL — ABNORMAL HIGH (ref 0.00–0.07)
Basophils Absolute: 0 10*3/uL (ref 0.0–0.1)
Basophils Relative: 0 %
Eosinophils Absolute: 0.1 10*3/uL (ref 0.0–0.5)
Eosinophils Relative: 1 %
HCT: 33.6 % — ABNORMAL LOW (ref 36.0–46.0)
Hemoglobin: 10.3 g/dL — ABNORMAL LOW (ref 12.0–15.0)
Immature Granulocytes: 1 %
Lymphocytes Relative: 16 %
Lymphs Abs: 1.7 10*3/uL (ref 0.7–4.0)
MCH: 27 pg (ref 26.0–34.0)
MCHC: 30.7 g/dL (ref 30.0–36.0)
MCV: 88 fL (ref 80.0–100.0)
Monocytes Absolute: 0.6 10*3/uL (ref 0.1–1.0)
Monocytes Relative: 5 %
Neutro Abs: 8.4 10*3/uL — ABNORMAL HIGH (ref 1.7–7.7)
Neutrophils Relative %: 77 %
Platelets: 255 10*3/uL (ref 150–400)
RBC: 3.82 MIL/uL — ABNORMAL LOW (ref 3.87–5.11)
RDW: 13.5 % (ref 11.5–15.5)
WBC: 10.9 10*3/uL — ABNORMAL HIGH (ref 4.0–10.5)
nRBC: 0 % (ref 0.0–0.2)

## 2022-01-11 LAB — COMPREHENSIVE METABOLIC PANEL
ALT: 16 U/L (ref 0–44)
AST: 21 U/L (ref 15–41)
Albumin: 3.5 g/dL (ref 3.5–5.0)
Alkaline Phosphatase: 46 U/L (ref 38–126)
Anion gap: 7 (ref 5–15)
BUN: 13 mg/dL (ref 6–20)
CO2: 27 mmol/L (ref 22–32)
Calcium: 8.1 mg/dL — ABNORMAL LOW (ref 8.9–10.3)
Chloride: 106 mmol/L (ref 98–111)
Creatinine, Ser: 0.83 mg/dL (ref 0.44–1.00)
GFR, Estimated: >60 mL/min (ref >60)
Glucose, Bld: 160 mg/dL — ABNORMAL HIGH (ref 70–99)
Potassium: 2.8 mmol/L — ABNORMAL LOW (ref 3.5–5.1)
Sodium: 140 mmol/L (ref 135–145)
Total Bilirubin: 0.3 mg/dL (ref 0.3–1.2)
Total Protein: 7.4 g/dL (ref 6.5–8.1)

## 2022-01-11 LAB — RAPID URINE DRUG SCREEN, HOSP PERFORMED
Amphetamines: NOT DETECTED
Barbiturates: NOT DETECTED
Benzodiazepines: NOT DETECTED
Cocaine: POSITIVE — AB
Opiates: NOT DETECTED
Tetrahydrocannabinol: POSITIVE — AB

## 2022-01-11 LAB — TROPONIN I (HIGH SENSITIVITY)
Troponin I (High Sensitivity): 14 ng/L (ref <18)
Troponin I (High Sensitivity): 36 ng/L — ABNORMAL HIGH (ref <18)

## 2022-01-11 LAB — I-STAT BETA HCG BLOOD, ED (MC, WL, AP ONLY): I-stat hCG, quantitative: <5 m[IU]/mL (ref <5)

## 2022-01-11 LAB — ACETAMINOPHEN LEVEL: Acetaminophen (Tylenol), Serum: <10 ug/mL — ABNORMAL LOW (ref 10–30)

## 2022-01-11 LAB — SALICYLATE LEVEL: Salicylate Lvl: <7 mg/dL — ABNORMAL LOW (ref 7.0–30.0)

## 2022-01-11 LAB — ETHANOL: Alcohol, Ethyl (B): <10 mg/dL (ref <10)

## 2022-01-11 MED ORDER — POTASSIUM CHLORIDE CRYS ER 20 MEQ PO TBCR
40.0000 meq | EXTENDED_RELEASE_TABLET | Freq: Two times a day (BID) | ORAL | Status: DC
  Administered 2022-01-11: 40 meq via ORAL
  Filled 2022-01-11: qty 2

## 2022-01-11 NOTE — Discharge Instructions (Signed)
You are seen in the ER today for your overdose.  Fortunately your exam was reassuring and you are doing well on your own at this time.  Please stop using all recreational drugs as this is exceptionally dangerous for you and you could have died tonight.  Please see attached resources for outpatient follow-up for substance use help.  Return to the ER with any severe symptoms.

## 2022-05-12 IMAGING — US US MFM OB COMP +14 WKS
1 series · 13 of 28 positions shown · non-contrast
Comparison: none

[Series 1: us mfm ob comp +14 wks · 70 acquisitions, 13 frames shown]
[im 3/70]
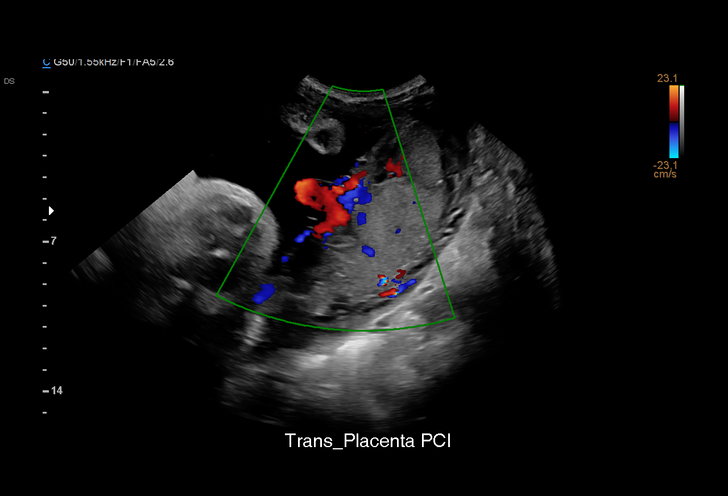
[im 8/70]
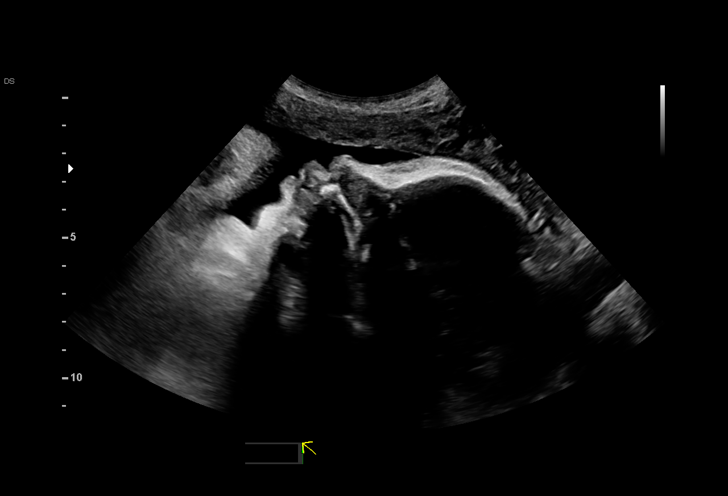
[im 13/70]
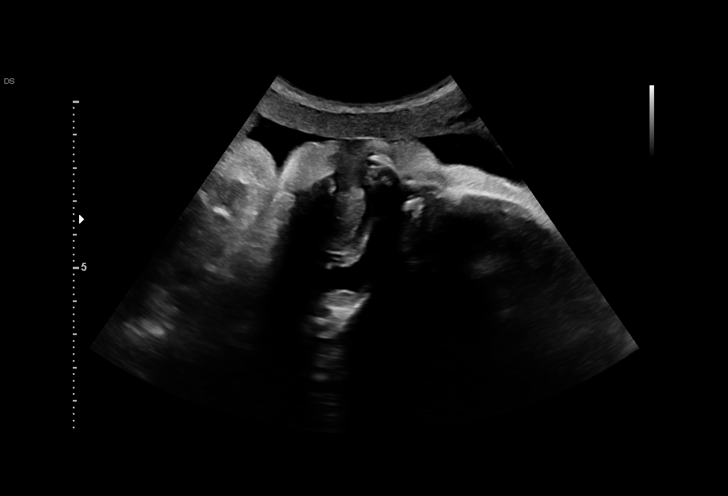
[im 18/70]
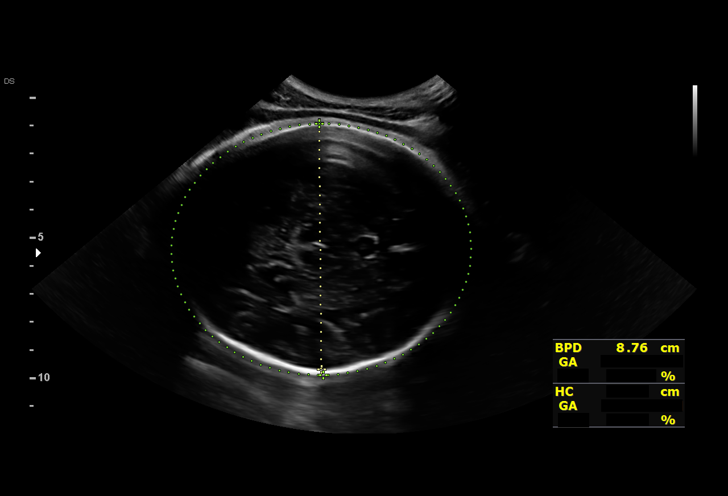
[im 24/70]
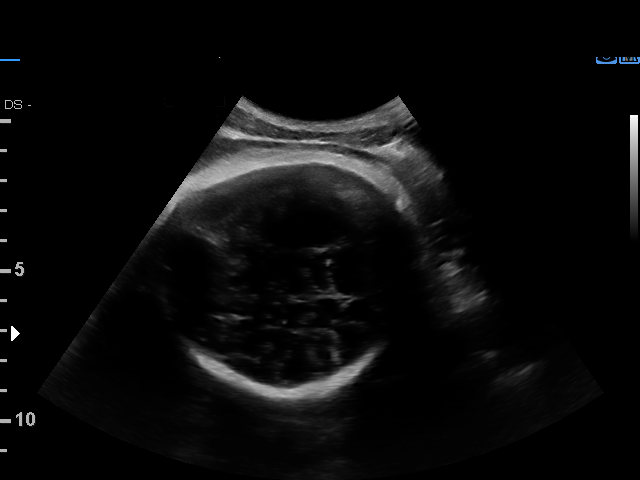
[im 29/70]
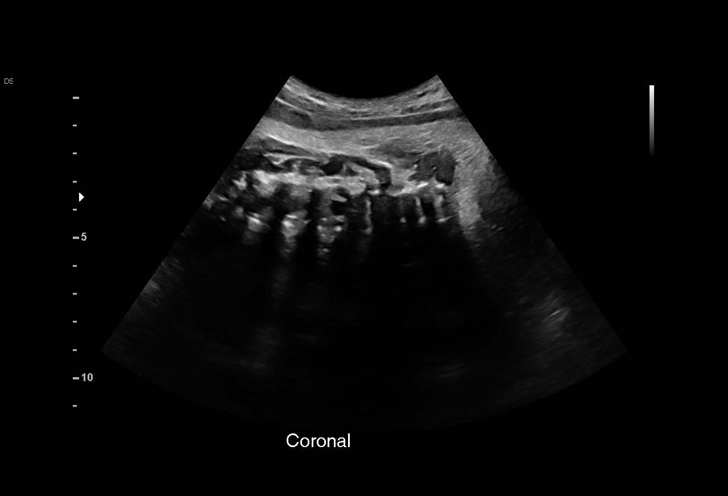
[im 36/70]
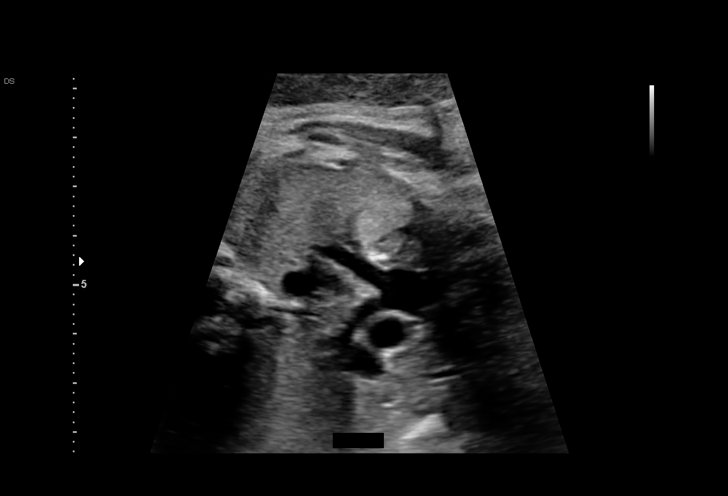
[im 41/70]
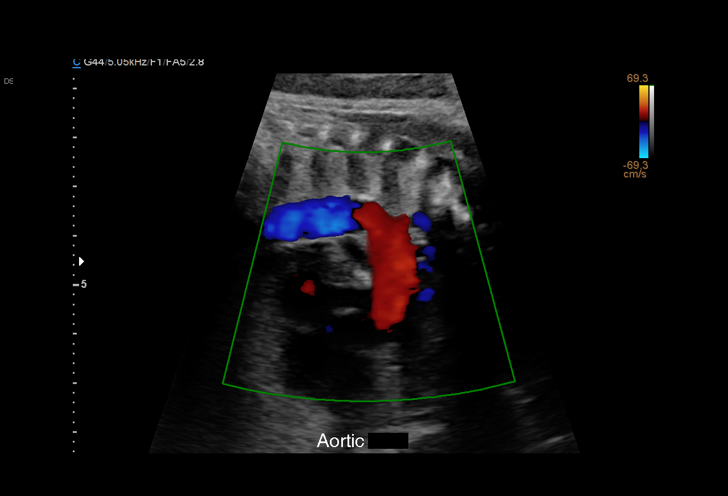
[im 47/70]
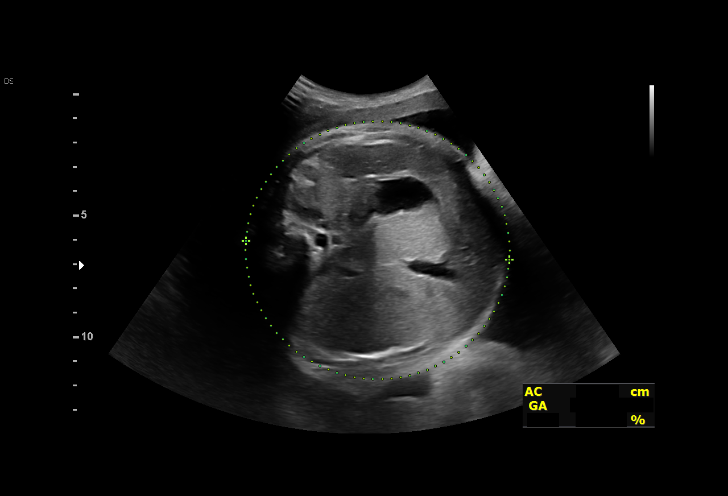
[im 52/70]
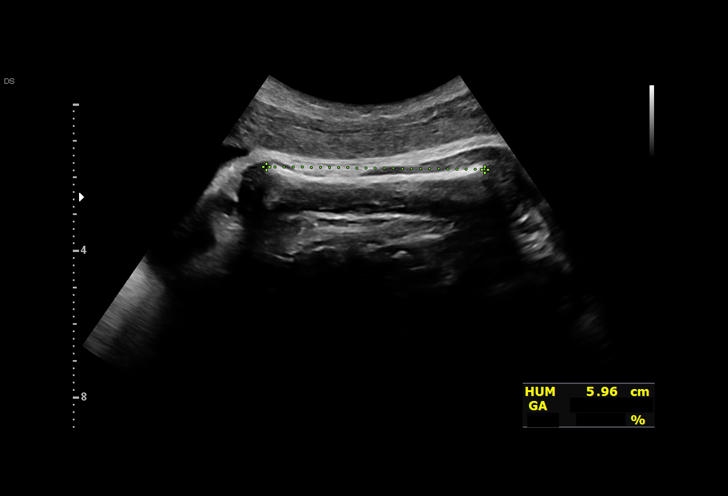
[im 57/70]
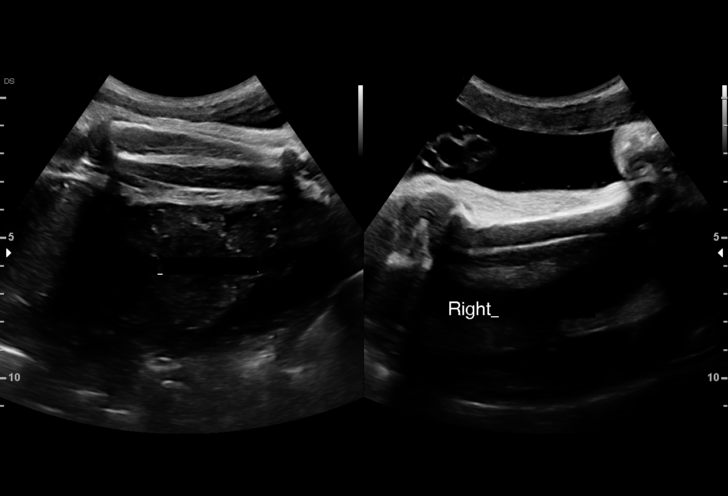
[im 62/70]
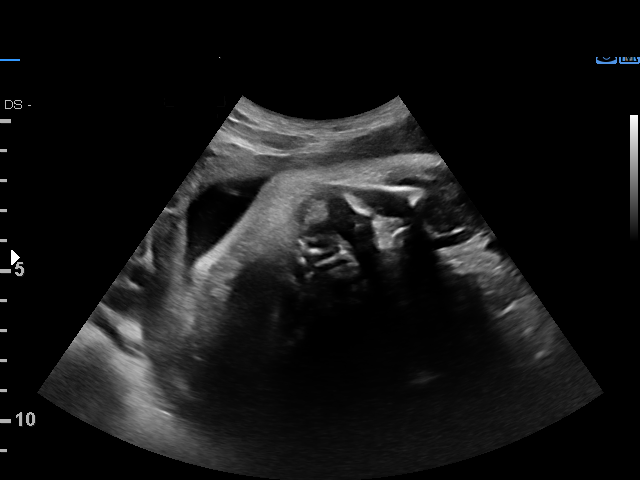
[im 67/70]
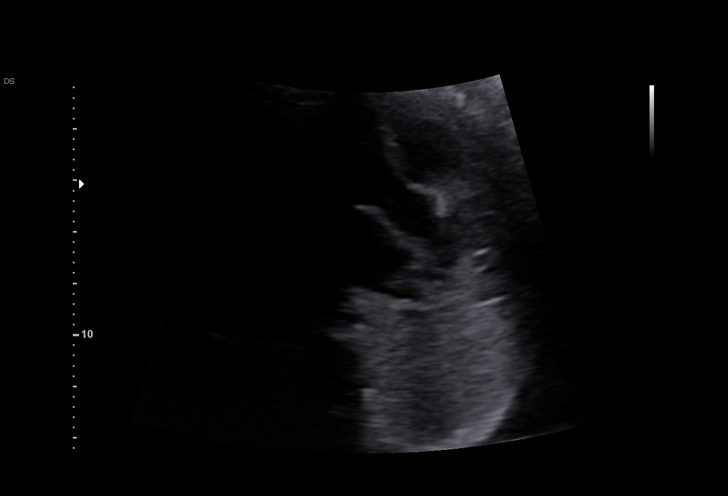

[13 of 28 positions shown; findings below may reference images not displayed]

1  US MFM OB COMP + 14 WK                76805.01    HANIA DAWKINS

Indications

 Uterine size-date discrepancy, third trimester
 35 weeks gestation of pregnancy
 Encounter for antenatal screening for
 malformations
Fetal Evaluation

 Num Of Fetuses:         1
 Cardiac Activity:       Observed
 Presentation:           Cephalic
 Placenta:               Fundal
 P. Cord Insertion:      Visualized, central

 AFI Sum(cm)     %Tile       Largest Pocket(cm)
 18.45           69

 RUQ(cm)       RLQ(cm)       LUQ(cm)        LLQ(cm)

Biometry

 BPD:      87.6  mm     G. Age:  35w 3d         44  %    CI:        79.93   %    70 - 86
                                                         FL/HC:      22.2   %    20.1 -
 HC:      309.6  mm     G. Age:  34w 4d          4  %    HC/AC:      0.91        0.93 -
 AC:      340.7  mm     G. Age:  38w 0d         97  %    FL/BPD:     78.4   %    71 - 87
 FL:       68.7  mm     G. Age:  35w 2d         30  %    FL/AC:      20.2   %    20 - 24
 HUM:      59.7  mm     G. Age:  34w 5d         41  %
 CER:      50.2  mm     G. Age:  36w 6d         62  %

 LV:          5  mm

 Est. FW:    3225  gm    6 lb 10 oz      73  %
OB History

 Gravidity:    2         Term:   1
 Living:       1
Gestational Age

 LMP:           31w 3d        Date:  07/16/19                 EDD:   04/21/20
 U/S Today:     35w 6d                                        EDD:   03/21/20
 Best:          35w 6d     Det. By:  U/S (02/21/20)           EDD:   03/21/20
Anatomy

 Cranium:               Appears normal         LVOT:                   Appears normal
 Cavum:                 Appears normal         Aortic Arch:            Appears normal
 Ventricles:            Appears normal         Ductal Arch:            Appears normal
 Choroid Plexus:        Appears normal         Diaphragm:              Appears normal
 Cerebellum:            Appears normal         Stomach:                Appears normal, left
                                                                       sided
 Posterior Fossa:       Appears normal         Abdomen:                Appears normal
 Nuchal Fold:           Not applicable (>20    Abdominal Wall:         Appears nml (cord
                        wks GA)                                        insert, abd wall)
 Face:                  Appears normal         Cord Vessels:           Appears normal (3
                        (orbits and profile)                           vessel cord)
 Lips:                  Appears normal         Kidneys:                Appear normal
 Palate:                Appears normal         Bladder:                Appears normal
 Thoracic:              Appears normal         Spine:                  Trans spine Not
                                                                       well visualized
 Heart:                 Appears normal         Upper Extremities:      Appears normal
                        (4CH, axis, and
                        situs)
 RVOT:                  Appears normal         Lower Extremities:      Appears normal

 Other:  Fetus appears to be female. Nasal bone visualized. VC, 3VV and
         3VTV visualized. Transverse spine views suboptimal
Cervix Uterus Adnexa

 Cervix
 Not visualized (advanced GA >76wks)

 Right Ovary
 Within normal limits.

 Left Ovary
 Within normal limits.
Comments

 This patient was seen for a detailed fetal anatomy scan as
 her fundal heights have been measuring larger than her
 dates.  The patient has not had an ultrasound performed in
 her current pregnancy.  She reports that she feels that she is
 probably further along in her pregnancy than is indicated by a
 presumed last menstrual period.  She denies any problems in
 her current pregnancy and denies any past medical history.
 She had a cell free DNA test earlier in her pregnancy which
 indicated a low risk for trisomy 21, 18, and 13. A female fetus
 is predicted.a
 Based on the fetal biometry measurements obtained today,
 her EDC is March 21, 2020, making her 35 weeks and 6
 days pregnant today.  The inaccuracies of estimation of her
 due date based on a third trimester ultrasound was
 discussed.  However, she was advised that she is most likely
 about a month further in her pregnancy than was indicated by
 her presumed last menstrual period.
 The views of the fetal anatomy were limited today due to her
 advanced gestational age.  However, there were no obvious
 anomalies noted today.
 The patient was informed that anomalies may be missed due
 to technical limitations. If the fetus is in a suboptimal position
 or maternal habitus is increased, visualization of the fetus in
 the maternal uterus may be impaired.
 No further exams were scheduled in our office.

## 2022-12-23 ENCOUNTER — Other Ambulatory Visit: Payer: Self-pay

## 2022-12-23 ENCOUNTER — Emergency Department (HOSPITAL_COMMUNITY)
Admission: EM | Admit: 2022-12-23 | Discharge: 2022-12-23 | Discharge status: Against Medical Advice | Payer: Medicaid Other | Attending: Emergency Medicine | Admitting: Emergency Medicine

## 2022-12-23 DIAGNOSIS — R112 Nausea with vomiting, unspecified: Secondary | ICD-10-CM | POA: Insufficient documentation

## 2022-12-23 DIAGNOSIS — Z5321 Procedure and treatment not carried out due to patient leaving prior to being seen by health care provider: Secondary | ICD-10-CM | POA: Insufficient documentation

## 2022-12-23 DIAGNOSIS — R197 Diarrhea, unspecified: Secondary | ICD-10-CM | POA: Insufficient documentation

## 2022-12-23 DIAGNOSIS — R109 Unspecified abdominal pain: Secondary | ICD-10-CM | POA: Diagnosis not present

## 2022-12-23 LAB — COMPREHENSIVE METABOLIC PANEL
ALT: 18 U/L (ref 0–44)
AST: 25 U/L (ref 15–41)
Albumin: 4.4 g/dL (ref 3.5–5.0)
Alkaline Phosphatase: 46 U/L (ref 38–126)
Anion gap: 13 (ref 5–15)
BUN: 16 mg/dL (ref 6–20)
CO2: 19 mmol/L — ABNORMAL LOW (ref 22–32)
Calcium: 9.9 mg/dL (ref 8.9–10.3)
Chloride: 107 mmol/L (ref 98–111)
Creatinine, Ser: 0.98 mg/dL (ref 0.44–1.00)
GFR, Estimated: >60 mL/min (ref >60)
Glucose, Bld: 176 mg/dL — ABNORMAL HIGH (ref 70–99)
Potassium: 2.9 mmol/L — ABNORMAL LOW (ref 3.5–5.1)
Sodium: 139 mmol/L (ref 135–145)
Total Bilirubin: 0.6 mg/dL (ref 0.3–1.2)
Total Protein: 8.7 g/dL — ABNORMAL HIGH (ref 6.5–8.1)

## 2022-12-23 LAB — CBC WITH DIFFERENTIAL/PLATELET
Abs Immature Granulocytes: 0.04 10*3/uL (ref 0.00–0.07)
Basophils Absolute: 0 10*3/uL (ref 0.0–0.1)
Basophils Relative: 0 %
Eosinophils Absolute: 0 10*3/uL (ref 0.0–0.5)
Eosinophils Relative: 0 %
HCT: 40.3 % (ref 36.0–46.0)
Hemoglobin: 12.6 g/dL (ref 12.0–15.0)
Immature Granulocytes: 0 %
Lymphocytes Relative: 33 %
Lymphs Abs: 3.1 10*3/uL (ref 0.7–4.0)
MCH: 26.8 pg (ref 26.0–34.0)
MCHC: 31.3 g/dL (ref 30.0–36.0)
MCV: 85.7 fL (ref 80.0–100.0)
Monocytes Absolute: 0.8 10*3/uL (ref 0.1–1.0)
Monocytes Relative: 9 %
Neutro Abs: 5.4 10*3/uL (ref 1.7–7.7)
Neutrophils Relative %: 58 %
Platelets: 357 10*3/uL (ref 150–400)
RBC: 4.7 MIL/uL (ref 3.87–5.11)
RDW: 13.7 % (ref 11.5–15.5)
WBC: 9.4 10*3/uL (ref 4.0–10.5)
nRBC: 0 % (ref 0.0–0.2)

## 2022-12-23 LAB — LIPASE, BLOOD: Lipase: 26 U/L (ref 11–51)

## 2022-12-23 LAB — HCG, SERUM, QUALITATIVE: Preg, Serum: NEGATIVE

## 2022-12-23 NOTE — ED Triage Notes (Signed)
Pt reports stomach bug x1 day with N/V/D and abdominal pain. Reports being around Mom with same s/s.

## 2023-01-25 IMAGING — US US OB LIMITED
1 series · 11 of 11 positions shown · non-contrast
Comparison: none

[Series 1: us ob limited · 11 of 11 slices shown]
[im 1/11]
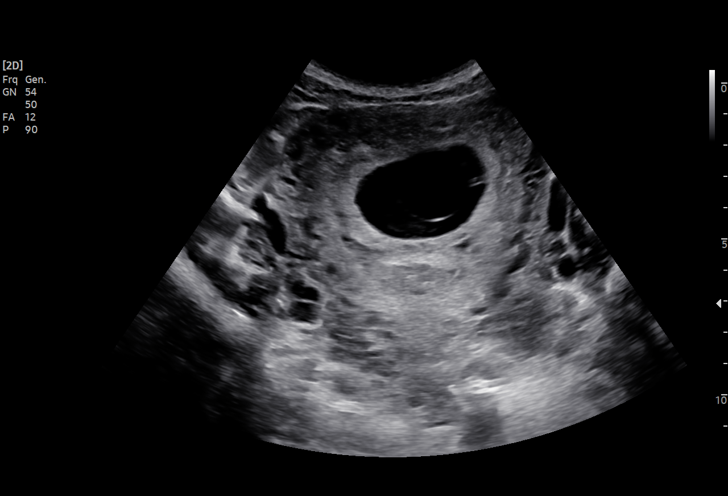
[im 2/11]
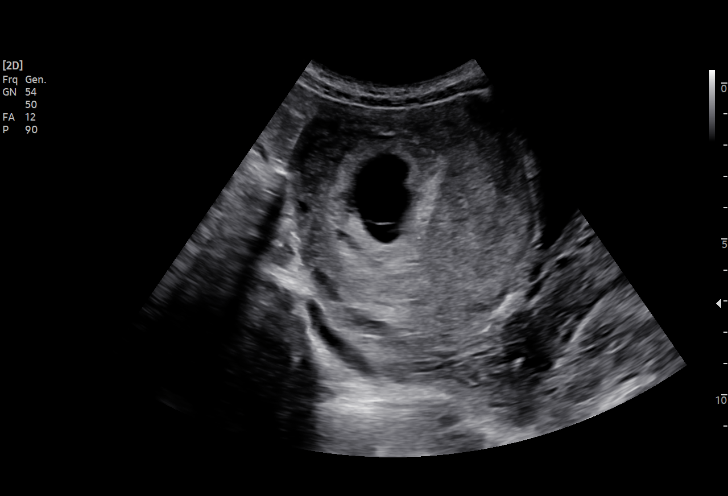
[im 3/11]
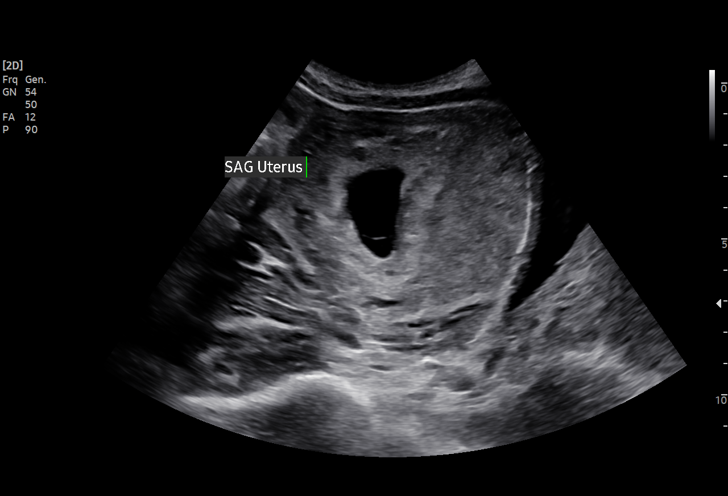
[im 4/11]
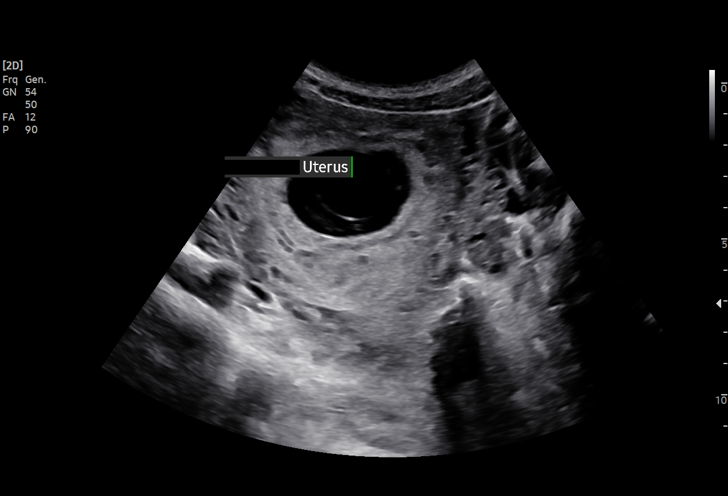
[im 5/11]
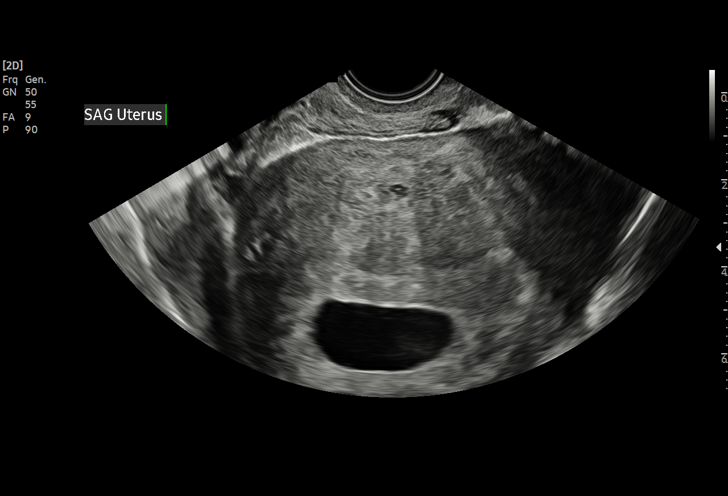
[im 6/11]
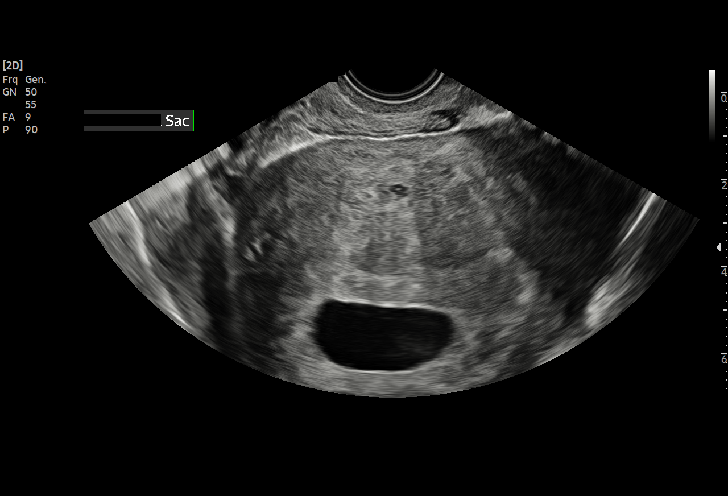
[im 7/11]
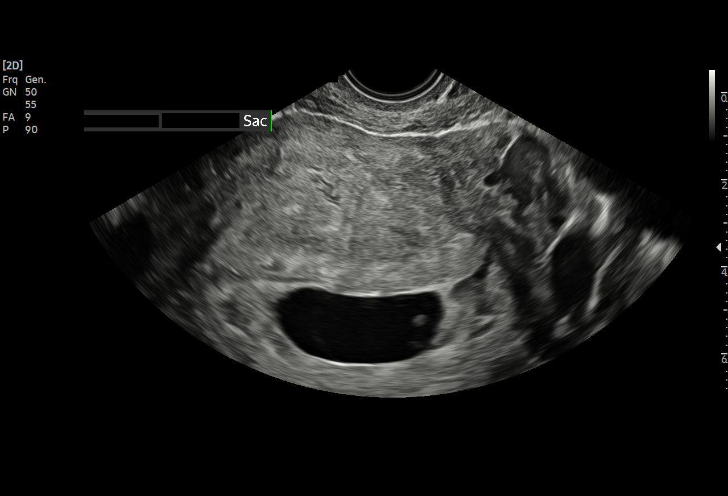
[im 8/11]
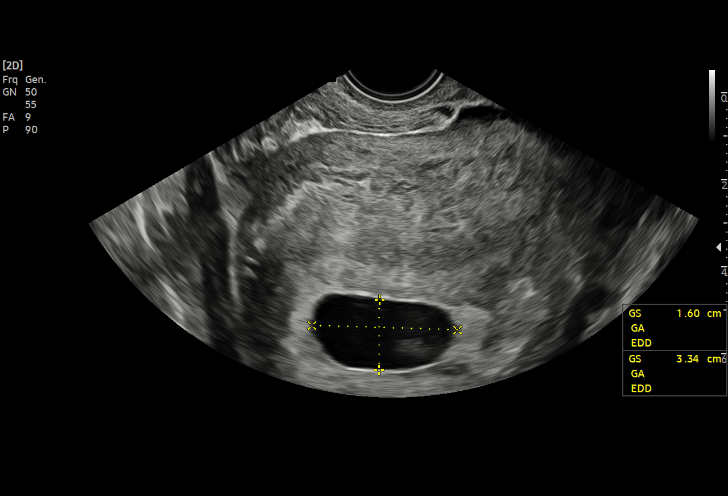
[im 9/11]
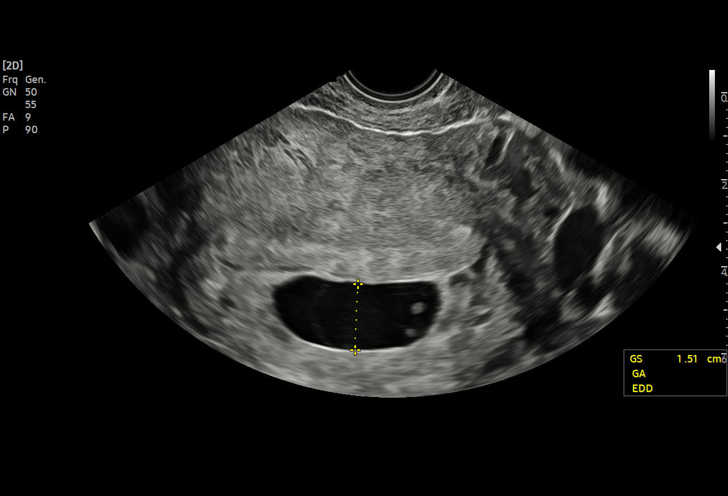
[im 10/11]
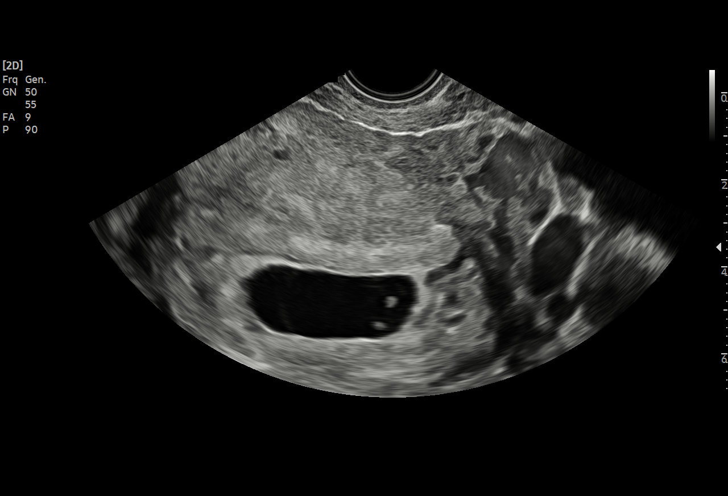
[im 11/11]
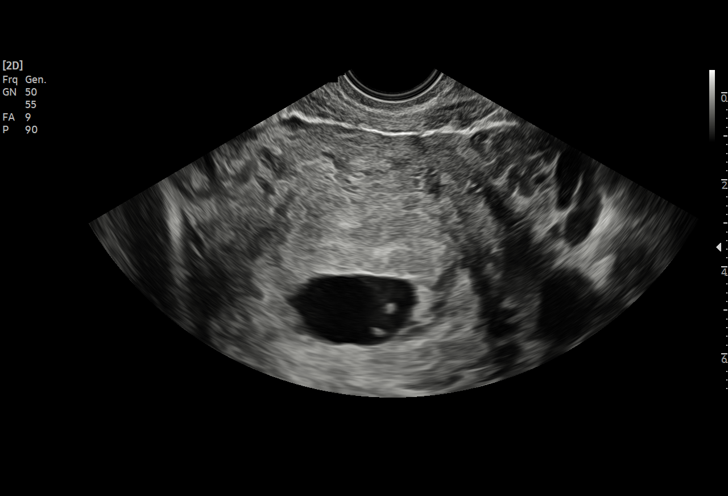

[11 of 11 positions shown; findings below may reference images not displayed]

[REDACTED]care
                                                              Healthcare

 1   [HOSPITAL]                        [address].0      [facility]

Indications

 Weeks of gestation of pregnancy not specified
Fetal Evaluation

 Num Of Fetuses:          1
 Preg. Location:          Intrauterine
 Gest. Sac:               Intrauterine
 Yolk Sac:                Not visualized
 Fetal Pole:              Not visualized
 Cardiac Activity:        No embryo visualized

 Comment:     Reviewed personally.  Discussed with patient and spouse.  Will get
              serial bhcg and repeat u/s in 1 week
Biometry

 GS:       21.5   mm     G. Age:  7w 2d                    EDD:   06/22/21
Comments

 Early gestational sac MSD 7w2d. No yolk sac or fetal pole
 identified. Meassurements are not consistent with LMP 07/23/20
 provided. Serial HCG levels and repeat u/s in one week ordered.

## 2023-05-25 ENCOUNTER — Other Ambulatory Visit: Payer: Self-pay

## 2023-05-25 ENCOUNTER — Encounter (HOSPITAL_COMMUNITY): Payer: Self-pay

## 2023-05-25 ENCOUNTER — Emergency Department (HOSPITAL_COMMUNITY)
Admission: EM | Admit: 2023-05-25 | Discharge: 2023-05-25 | Discharge status: Against Medical Advice | Payer: Medicaid Other | Attending: Emergency Medicine | Admitting: Emergency Medicine

## 2023-05-25 DIAGNOSIS — R1013 Epigastric pain: Secondary | ICD-10-CM | POA: Diagnosis present

## 2023-05-25 DIAGNOSIS — E860 Dehydration: Secondary | ICD-10-CM | POA: Insufficient documentation

## 2023-05-25 DIAGNOSIS — F12188 Cannabis abuse with other cannabis-induced disorder: Secondary | ICD-10-CM | POA: Insufficient documentation

## 2023-05-25 DIAGNOSIS — R112 Nausea with vomiting, unspecified: Secondary | ICD-10-CM

## 2023-05-25 DIAGNOSIS — E876 Hypokalemia: Secondary | ICD-10-CM | POA: Insufficient documentation

## 2023-05-25 DIAGNOSIS — Z5329 Procedure and treatment not carried out because of patient's decision for other reasons: Secondary | ICD-10-CM | POA: Diagnosis not present

## 2023-05-25 LAB — CBC
HCT: 42.2 % (ref 36.0–46.0)
Hemoglobin: 13.8 g/dL (ref 12.0–15.0)
MCH: 27.2 pg (ref 26.0–34.0)
MCHC: 32.7 g/dL (ref 30.0–36.0)
MCV: 83.2 fL (ref 80.0–100.0)
Platelets: 371 10*3/uL (ref 150–400)
RBC: 5.07 MIL/uL (ref 3.87–5.11)
RDW: 12.8 % (ref 11.5–15.5)
WBC: 15.3 10*3/uL — ABNORMAL HIGH (ref 4.0–10.5)
nRBC: 0 % (ref 0.0–0.2)

## 2023-05-25 LAB — COMPREHENSIVE METABOLIC PANEL
ALT: 31 U/L (ref 0–44)
AST: 39 U/L (ref 15–41)
Albumin: 5 g/dL (ref 3.5–5.0)
Alkaline Phosphatase: 46 U/L (ref 38–126)
Anion gap: 16 — ABNORMAL HIGH (ref 5–15)
BUN: 24 mg/dL — ABNORMAL HIGH (ref 6–20)
CO2: 20 mmol/L — ABNORMAL LOW (ref 22–32)
Calcium: 10.6 mg/dL — ABNORMAL HIGH (ref 8.9–10.3)
Chloride: 99 mmol/L (ref 98–111)
Creatinine, Ser: 1.18 mg/dL — ABNORMAL HIGH (ref 0.44–1.00)
GFR, Estimated: >60 mL/min (ref >60)
Glucose, Bld: 134 mg/dL — ABNORMAL HIGH (ref 70–99)
Potassium: 3.2 mmol/L — ABNORMAL LOW (ref 3.5–5.1)
Sodium: 135 mmol/L (ref 135–145)
Total Bilirubin: 0.4 mg/dL (ref 0.0–1.2)
Total Protein: 9.4 g/dL — ABNORMAL HIGH (ref 6.5–8.1)

## 2023-05-25 LAB — MAGNESIUM: Magnesium: 1.6 mg/dL — ABNORMAL LOW (ref 1.7–2.4)

## 2023-05-25 LAB — URINALYSIS, ROUTINE W REFLEX MICROSCOPIC
Bacteria, UA: NONE SEEN
Bilirubin Urine: NEGATIVE
Glucose, UA: NEGATIVE mg/dL
Ketones, ur: NEGATIVE mg/dL
Nitrite: NEGATIVE
Protein, ur: >=300 mg/dL — AB
RBC / HPF: >50 RBC/hpf (ref 0–5)
Specific Gravity, Urine: 1.037 — ABNORMAL HIGH (ref 1.005–1.030)
pH: 5 (ref 5.0–8.0)

## 2023-05-25 LAB — HCG, SERUM, QUALITATIVE: Preg, Serum: NEGATIVE

## 2023-05-25 LAB — LIPASE, BLOOD: Lipase: 35 U/L (ref 11–51)

## 2023-05-25 MED ORDER — ONDANSETRON 4 MG PO TBDP
4.0000 mg | ORAL_TABLET | Freq: Once | ORAL | Status: AC
  Administered 2023-05-25: 4 mg via ORAL
  Filled 2023-05-25: qty 1

## 2023-05-25 MED ORDER — DROPERIDOL 2.5 MG/ML IJ SOLN
1.2500 mg | Freq: Once | INTRAMUSCULAR | Status: DC
  Filled 2023-05-25: qty 2

## 2023-05-25 MED ORDER — POTASSIUM CHLORIDE 10 MEQ/100ML IV SOLN
10.0000 meq | Freq: Once | INTRAVENOUS | Status: DC
  Filled 2023-05-25: qty 100

## 2023-05-25 MED ORDER — SODIUM CHLORIDE 0.9 % IV BOLUS: 1000.0000 mL | Freq: Once | INTRAVENOUS | Status: DC

## 2023-05-25 NOTE — ED Notes (Signed)
 Pt is not currently in the room will check back

## 2023-05-25 NOTE — ED Triage Notes (Addendum)
 Patient reports nausea, vomiting, and diarrhea x 2 days. Denies fevers.

## 2023-05-25 NOTE — ED Provider Notes (Signed)
 [place] EMERGENCY DEPARTMENT AT [facility] Provider Note   CSN: [id number] Arrival date & time: 05/25/23  0957     History  Chief Complaint  Patient presents with   Emesis    Mariah Leonard is a 28 y.o. female.  Pt is a 28 yo female with pmhx significant for cannabinoid hyperemesis syndrome.  Pt said she started having n/v/d 2 days ago.  She denies any fevers.  She is worried she is pregnant as she does not think she's had a period in 2 months.        Home Medications Prior to Admission medications   Not on File      Allergies    Patient has no known allergies.    Review of Systems   Review of Systems  Gastrointestinal:  Positive for abdominal pain, nausea and vomiting.  All other systems reviewed and are negative.   Physical Exam Updated Vital Signs BP 138/81 (BP Location: Left Arm)   Pulse 72   Temp 98.5 F (36.9 C) (Oral)   Resp 16   Ht 5' 5 (1.651 m)   Wt 59 kg   SpO2 100%   BMI 21.63 kg/m  Physical Exam Vitals reviewed.  Constitutional:      Appearance: Normal appearance.  HENT:     Head: Normocephalic and atraumatic.     Right Ear: External ear normal.     Left Ear: External ear normal.     Nose: Nose normal.     Mouth/Throat:     Mouth: Mucous membranes are dry.  Eyes:     Extraocular Movements: Extraocular movements intact.     Conjunctiva/sclera: Conjunctivae normal.     Pupils: Pupils are equal, round, and reactive to light.  Cardiovascular:     Rate and Rhythm: Regular rhythm. Tachycardia present.     Pulses: Normal pulses.     Heart sounds: Normal heart sounds.  Pulmonary:     Effort: Pulmonary effort is normal.     Breath sounds: Normal breath sounds.  Abdominal:     General: Abdomen is flat. Bowel sounds are normal.     Palpations: Abdomen is soft.     Tenderness: There is abdominal tenderness in the epigastric area.  Musculoskeletal:        General: Normal range of motion.     Cervical back: Normal range  of motion and neck supple.  Skin:    General: Skin is warm.     Capillary Refill: Capillary refill takes less than 2 seconds.  Neurological:     General: No focal deficit present.     Mental Status: She is alert and oriented to person, place, and time.  Psychiatric:        Mood and Affect: Mood normal.        Behavior: Behavior normal.     ED Results / Procedures / Treatments   Labs (all labs ordered are listed, but only abnormal results are displayed) Labs Reviewed  COMPREHENSIVE METABOLIC PANEL - Abnormal; Notable for the following components:      Result Value   Potassium 3.2 (*)    CO2 20 (*)    Glucose, Bld 134 (*)    BUN 24 (*)    Creatinine, Ser 1.18 (*)    Calcium  10.6 (*)    Total Protein 9.4 (*)    Anion gap 16 (*)    All other components within normal limits  CBC - Abnormal; Notable for the following components:  WBC 15.3 (*)    All other components within normal limits  URINALYSIS, ROUTINE W REFLEX MICROSCOPIC - Abnormal; Notable for the following components:   APPearance HAZY (*)    Specific Gravity, Urine 1.037 (*)    Hgb urine dipstick LARGE (*)    Protein, ur >=300 (*)    Leukocytes,Ua TRACE (*)    All other components within normal limits  MAGNESIUM  - Abnormal; Notable for the following components:   Magnesium  1.6 (*)    All other components within normal limits  LIPASE, BLOOD  HCG, SERUM, QUALITATIVE    EKG None  Radiology No results found.  Procedures Procedures    Medications Ordered in ED Medications  sodium chloride  0.9 % bolus 1,000 mL (has no administration in time range)  droperidol  (INAPSINE ) 2.5 MG/ML injection 1.25 mg (has no administration in time range)  potassium chloride  10 mEq in 100 mL IVPB (has no administration in time range)  ondansetron  (ZOFRAN -ODT) disintegrating tablet 4 mg (4 mg Oral Given 05/25/23 1037)    ED Course/ Medical Decision Making/ A&P                                 Medical Decision Making Amount  and/or Complexity of Data Reviewed Labs: ordered.  Risk Prescription drug management.   This patient presents to the ED for concern of n/v and abd pain, this involves an extensive number of treatment options, and is a complaint that carries with it a high risk of complications and morbidity.  The differential diagnosis includes hyperemesis, uti, pregnancy, gastritis, pancreatitis   Co morbidities that complicate the patient evaluation  cannabinoid hyperemesis syndrome   Additional history obtained:  Additional history obtained from epic chart review  Lab Tests:  I Ordered, and personally interpreted labs.  The pertinent results include:  cbc with wbc elevated at 15.3, cmp with k low at 3.2, bun elevated at 24 and cr elevated at 1.18, ua with blood and protein, preg neg  Medicines ordered and prescription drug management:  I ordered medication including ivfs/inapsine   for sx     Test Considered:  ct   Problem List / ED Course:  Abd pain with n/v:  most likely cannabinoid hyperemesis syndrome, but she has an elevated wbc and low k and mg, so she may need a ct abd.  The nurse went in to start the iv and give the meds, but she was gone.  She did not tell anyone she was leaving.  She did not receive any instructions and did not complete her work up.  Social Determinants of Health:  Lives at home   Dispostion:  Pt eloped prior to getting ivfs/meds.        Final Clinical Impression(s) / ED Diagnoses Final diagnoses:  Dehydration  Cannabinoid hyperemesis syndrome  Hypokalemia  Hypomagnesemia    Rx / DC Orders ED Discharge Orders     None         Dean Clarity, MD 05/25/23 1320

## 2023-05-27 ENCOUNTER — Emergency Department (HOSPITAL_COMMUNITY)
Admission: EM | Admit: 2023-05-27 | Discharge: 2023-05-27 | Discharge status: Against Medical Advice | Payer: Medicaid Other | Attending: Emergency Medicine | Admitting: Emergency Medicine

## 2023-05-27 DIAGNOSIS — E860 Dehydration: Secondary | ICD-10-CM | POA: Diagnosis not present

## 2023-05-27 DIAGNOSIS — R112 Nausea with vomiting, unspecified: Secondary | ICD-10-CM | POA: Diagnosis present

## 2023-05-27 DIAGNOSIS — R197 Diarrhea, unspecified: Secondary | ICD-10-CM | POA: Diagnosis not present

## 2023-05-27 DIAGNOSIS — Z5321 Procedure and treatment not carried out due to patient leaving prior to being seen by health care provider: Secondary | ICD-10-CM | POA: Diagnosis not present

## 2023-05-27 LAB — URINALYSIS, ROUTINE W REFLEX MICROSCOPIC
Bilirubin Urine: NEGATIVE
Glucose, UA: NEGATIVE mg/dL
Ketones, ur: NEGATIVE mg/dL
Nitrite: NEGATIVE
Protein, ur: 100 mg/dL — AB
RBC / HPF: >50 RBC/hpf (ref 0–5)
Specific Gravity, Urine: 1.032 — ABNORMAL HIGH (ref 1.005–1.030)
pH: 5 (ref 5.0–8.0)

## 2023-05-27 LAB — COMPREHENSIVE METABOLIC PANEL
ALT: 25 U/L (ref 0–44)
AST: 37 U/L (ref 15–41)
Albumin: 4.4 g/dL (ref 3.5–5.0)
Alkaline Phosphatase: 46 U/L (ref 38–126)
Anion gap: 13 (ref 5–15)
BUN: 18 mg/dL (ref 6–20)
CO2: 22 mmol/L (ref 22–32)
Calcium: 9.9 mg/dL (ref 8.9–10.3)
Chloride: 101 mmol/L (ref 98–111)
Creatinine, Ser: 0.99 mg/dL (ref 0.44–1.00)
GFR, Estimated: >60 mL/min (ref >60)
Glucose, Bld: 123 mg/dL — ABNORMAL HIGH (ref 70–99)
Potassium: 2.9 mmol/L — ABNORMAL LOW (ref 3.5–5.1)
Sodium: 136 mmol/L (ref 135–145)
Total Bilirubin: 1 mg/dL (ref 0.0–1.2)
Total Protein: 8.4 g/dL — ABNORMAL HIGH (ref 6.5–8.1)

## 2023-05-27 LAB — CBC
HCT: 44.4 % (ref 36.0–46.0)
Hemoglobin: 14.8 g/dL (ref 12.0–15.0)
MCH: 27.5 pg (ref 26.0–34.0)
MCHC: 33.3 g/dL (ref 30.0–36.0)
MCV: 82.4 fL (ref 80.0–100.0)
Platelets: 369 10*3/uL (ref 150–400)
RBC: 5.39 MIL/uL — ABNORMAL HIGH (ref 3.87–5.11)
RDW: 12.7 % (ref 11.5–15.5)
WBC: 12.8 10*3/uL — ABNORMAL HIGH (ref 4.0–10.5)
nRBC: 0 % (ref 0.0–0.2)

## 2023-05-27 LAB — HCG, SERUM, QUALITATIVE: Preg, Serum: NEGATIVE

## 2023-05-27 LAB — LIPASE, BLOOD: Lipase: 104 U/L — ABNORMAL HIGH (ref 11–51)

## 2023-05-27 MED ORDER — ONDANSETRON 4 MG PO TBDP
4.0000 mg | ORAL_TABLET | Freq: Once | ORAL | Status: AC
  Administered 2023-05-27: 4 mg via ORAL
  Filled 2023-05-27: qty 1

## 2023-05-27 NOTE — ED Triage Notes (Signed)
 Pt. Stated, I was at [facility] 2 days ago for N/V/D and had to leave. I had my blood drawn and now Im still having nausea and Im dehydrated and need IV fluids. Ive not eaten in 3 days.

## 2023-05-29 ENCOUNTER — Emergency Department (HOSPITAL_COMMUNITY)
Admission: EM | Admit: 2023-05-29 | Discharge: 2023-05-29 | Discharge status: Against Medical Advice | Payer: Medicaid Other | Attending: Emergency Medicine | Admitting: Emergency Medicine

## 2023-05-29 ENCOUNTER — Encounter (HOSPITAL_COMMUNITY): Payer: Self-pay | Admitting: *Deleted

## 2023-05-29 ENCOUNTER — Other Ambulatory Visit: Payer: Self-pay

## 2023-05-29 DIAGNOSIS — R109 Unspecified abdominal pain: Secondary | ICD-10-CM | POA: Insufficient documentation

## 2023-05-29 DIAGNOSIS — R112 Nausea with vomiting, unspecified: Secondary | ICD-10-CM | POA: Insufficient documentation

## 2023-05-29 DIAGNOSIS — R197 Diarrhea, unspecified: Secondary | ICD-10-CM | POA: Diagnosis not present

## 2023-05-29 DIAGNOSIS — Z5321 Procedure and treatment not carried out due to patient leaving prior to being seen by health care provider: Secondary | ICD-10-CM | POA: Diagnosis not present

## 2023-05-29 NOTE — ED Notes (Signed)
Pt went out to get money from family, not in room.

## 2023-05-29 NOTE — ED Notes (Signed)
Pt back into room to grab her things and choosing to leave. Declines staying. Encouraged to stay, return if changes mind, worsens or develops new sx.

## 2023-05-29 NOTE — ED Triage Notes (Addendum)
Here from home for abd pain, NVD. Onset 7day ago. Seen here 2d ago, but had to leave d/t wait. V x10 in last 24 hrs. D x3 in last 24 hrs. Denies sick contacts, family with similar sx, suspicious events or foods. Denies fever, gas, heartburn, constipation, or syncope. Here d/t "feeling dehydrated". Actively vomiting at this time.

## 2023-05-29 NOTE — ED Notes (Signed)
EDPA by to see pt, pt not available.

## 2023-05-29 NOTE — ED Notes (Signed)
Lab by for blood draw, pt not available.

## 2023-06-01 ENCOUNTER — Other Ambulatory Visit: Payer: Self-pay

## 2023-06-01 ENCOUNTER — Encounter (HOSPITAL_COMMUNITY): Payer: Self-pay

## 2023-06-01 ENCOUNTER — Emergency Department (HOSPITAL_COMMUNITY)
Admission: EM | Admit: 2023-06-01 | Discharge: 2023-06-01 | Discharge status: Against Medical Advice | Payer: Medicaid Other | Attending: Emergency Medicine | Admitting: Emergency Medicine

## 2023-06-01 ENCOUNTER — Ambulatory Visit (HOSPITAL_COMMUNITY)
Admission: EM | Admit: 2023-06-01 | Discharge: 2023-06-01 | Disposition: A | Discharge status: Other Acute Inpatient Hospital | Payer: Medicaid Other

## 2023-06-01 DIAGNOSIS — R112 Nausea with vomiting, unspecified: Secondary | ICD-10-CM | POA: Insufficient documentation

## 2023-06-01 DIAGNOSIS — R1084 Generalized abdominal pain: Secondary | ICD-10-CM | POA: Diagnosis present

## 2023-06-01 LAB — CBC
HCT: 43.4 % (ref 36.0–46.0)
Hemoglobin: 14 g/dL (ref 12.0–15.0)
MCH: 27 pg (ref 26.0–34.0)
MCHC: 32.3 g/dL (ref 30.0–36.0)
MCV: 83.8 fL (ref 80.0–100.0)
Platelets: 432 10*3/uL — ABNORMAL HIGH (ref 150–400)
RBC: 5.18 MIL/uL — ABNORMAL HIGH (ref 3.87–5.11)
RDW: 12.3 % (ref 11.5–15.5)
WBC: 8.6 10*3/uL (ref 4.0–10.5)
nRBC: 0 % (ref 0.0–0.2)

## 2023-06-01 LAB — COMPREHENSIVE METABOLIC PANEL
ALT: 18 U/L (ref 0–44)
AST: 24 U/L (ref 15–41)
Albumin: 4.3 g/dL (ref 3.5–5.0)
Alkaline Phosphatase: 44 U/L (ref 38–126)
Anion gap: 13 (ref 5–15)
BUN: 10 mg/dL (ref 6–20)
CO2: 30 mmol/L (ref 22–32)
Calcium: 9.8 mg/dL (ref 8.9–10.3)
Chloride: 93 mmol/L — ABNORMAL LOW (ref 98–111)
Creatinine, Ser: 0.94 mg/dL (ref 0.44–1.00)
GFR, Estimated: >60 mL/min (ref >60)
Glucose, Bld: 125 mg/dL — ABNORMAL HIGH (ref 70–99)
Potassium: 2.9 mmol/L — ABNORMAL LOW (ref 3.5–5.1)
Sodium: 136 mmol/L (ref 135–145)
Total Bilirubin: 0.9 mg/dL (ref 0.0–1.2)
Total Protein: 7.9 g/dL (ref 6.5–8.1)

## 2023-06-01 LAB — URINALYSIS, ROUTINE W REFLEX MICROSCOPIC
Bilirubin Urine: NEGATIVE
Glucose, UA: NEGATIVE mg/dL
Ketones, ur: NEGATIVE mg/dL
Nitrite: NEGATIVE
Protein, ur: NEGATIVE mg/dL
Specific Gravity, Urine: 1.003 — ABNORMAL LOW (ref 1.005–1.030)
pH: 7 (ref 5.0–8.0)

## 2023-06-01 LAB — LIPASE, BLOOD: Lipase: 64 U/L — ABNORMAL HIGH (ref 11–51)

## 2023-06-01 LAB — HCG, SERUM, QUALITATIVE: Preg, Serum: NEGATIVE

## 2023-06-01 MED ORDER — ALUM & MAG HYDROXIDE-SIMETH 200-200-20 MG/5ML PO SUSP
30.0000 mL | Freq: Once | ORAL | Status: AC
Start: 2023-06-01 — End: 2023-06-01
  Administered 2023-06-01: 30 mL via ORAL
  Filled 2023-06-01: qty 30

## 2023-06-01 MED ORDER — FAMOTIDINE 20 MG PO TABS
20.0000 mg | ORAL_TABLET | Freq: Once | ORAL | Status: AC
  Administered 2023-06-01: 20 mg via ORAL
  Filled 2023-06-01: qty 1

## 2023-06-01 MED ORDER — POTASSIUM CHLORIDE 10 MEQ/100ML IV SOLN
10.0000 meq | Freq: Once | INTRAVENOUS | Status: AC
  Administered 2023-06-01: 10 meq via INTRAVENOUS
  Filled 2023-06-01: qty 100

## 2023-06-01 MED ORDER — LACTATED RINGERS IV BOLUS
1000.0000 mL | Freq: Once | INTRAVENOUS | Status: AC
Start: 2023-06-01 — End: 2023-06-01
  Administered 2023-06-01: 1000 mL via INTRAVENOUS

## 2023-06-01 MED ORDER — METOCLOPRAMIDE HCL 5 MG/ML IJ SOLN
10.0000 mg | Freq: Once | INTRAMUSCULAR | Status: AC
  Administered 2023-06-01: 10 mg via INTRAVENOUS
  Filled 2023-06-01: qty 2

## 2023-06-01 NOTE — ED Provider Notes (Signed)
[place] EMERGENCY DEPARTMENT AT [facility] Provider Note   CSN: [id number] Arrival date & time: 06/01/23  1501     History Chief Complaint  Patient presents with   Nausea    HPI Mariah Leonard is a 28 y.o. female presenting for severe nausea vomiting.  She is a 28 year old female.  States that she has had 5 days of intractable nausea and vomiting.  Has been to the emergency department multiple times believing due to extended wait times. However today she can keep nothing down with 5+ episodes of nonbilious nonbloody vomiting. Uses marijuana products.  Never had a history of a reaction like this in the past.  Did have a GI bug 2 weeks ago with diarrhea that is now resolved.  She is ambulatory but feels very dehydrated and weak.  States she has lost about 10 pounds. Denies fevers or chills, surgical history.  Patient's recorded medical, surgical, social, medication list and allergies were reviewed in the Snapshot window as part of the initial history.   Review of Systems   Review of Systems  Constitutional:  Positive for fatigue. Negative for chills and fever.  HENT:  Negative for ear pain and sore throat.   Eyes:  Negative for pain and visual disturbance.  Respiratory:  Negative for cough and shortness of breath.   Cardiovascular:  Negative for chest pain and palpitations.  Gastrointestinal:  Positive for nausea and vomiting. Negative for abdominal pain.  Genitourinary:  Negative for dysuria and hematuria.  Musculoskeletal:  Negative for arthralgias and back pain.  Skin:  Negative for color change and rash.  Neurological:  Negative for seizures and syncope.  All other systems reviewed and are negative.   Physical Exam Updated Vital Signs BP (!) 129/96 (BP Location: Right Arm)   Pulse 91   Temp 98.2 F (36.8 C) (Oral)   Resp 16   Ht 5\' 5"  (1.651 m)   Wt 59 kg   LMP 04/26/2023 (Exact Date)   SpO2 95%   BMI 21.63 kg/m  Physical Exam Vitals and nursing  note reviewed.  Constitutional:      General: She is not in acute distress.    Appearance: She is well-developed.  HENT:     Head: Normocephalic and atraumatic.  Eyes:     Conjunctiva/sclera: Conjunctivae normal.  Cardiovascular:     Rate and Rhythm: Normal rate and regular rhythm.     Heart sounds: No murmur heard. Pulmonary:     Effort: Pulmonary effort is normal. No respiratory distress.     Breath sounds: Normal breath sounds.  Abdominal:     General: There is no distension.     Palpations: Abdomen is soft.     Tenderness: There is abdominal tenderness. There is guarding. There is no right CVA tenderness or left CVA tenderness.  Musculoskeletal:        General: No swelling or tenderness. Normal range of motion.     Cervical back: Neck supple.  Skin:    General: Skin is warm and dry.  Neurological:     General: No focal deficit present.     Mental Status: She is alert and oriented to person, place, and time. Mental status is at baseline.     Cranial Nerves: No cranial nerve deficit.      ED Course/ Medical Decision Making/ A&P    Procedures Procedures   Medications Ordered in ED Medications  lactated ringers bolus 1,000 mL (0 mLs Intravenous Stopped 06/01/23 1739)  potassium  chloride 10 mEq in 100 mL IVPB (0 mEq Intravenous Stopped 06/01/23 1739)  metoCLOPramide (REGLAN) injection 10 mg (10 mg Intravenous Given 06/01/23 1702)  famotidine (PEPCID) tablet 20 mg (20 mg Oral Given 06/01/23 1705)  alum & mag hydroxide-simeth (MAALOX/MYLANTA) 200-200-20 MG/5ML suspension 30 mL (30 mLs Oral Given 06/01/23 1704)   Medical Decision Making:   Mariah Leonard is a 28 y.o. female who presented to the ED today with abdominal pain, detailed above.    Patient placed on continuous vitals and telemetry monitoring while in ED which was reviewed periodically.  Complete initial physical exam performed, notably the patient  was HDS in NAD. Diffusely tender abdomen.     Reviewed and  confirmed nursing documentation for past medical history, family history, social history.    Initial Assessment:   With the patient's presentation of abdominal pain, most likely diagnosis is nonspecific etiology. Other diagnoses were considered including (but not limited to) gastroenteritis, colitis, small bowel obstruction, appendicitis, cholecystitis, pancreatitis, nephrolithiasis, UTI, pyleonephritis, ruptured ectopic pregnancy, PID, ovarian torsion. These are considered less likely due to history of present illness and physical exam findings.   This is most consistent with an acute life/limb threatening illness complicated by underlying chronic conditions.   Initial Plan:  CBC/CMP to evaluate for underlying infectious/metabolic etiology for patient's abdominal pain  Lipase to evaluate for pancreatitis  EKG to evaluate for cardiac source of pain  CTAB/Pelvis with contrast to evaluate for structural/surgical etiology of patients' severe abdominal pain.  Pregnancy Test Urinalysis and repeat physical assessment to evaluate for UTI/Pyelonpehritis  Empiric management of symptoms with escalating pain control and antiemetics as needed.   Final Reassessment and Plan:   All of the above studies were ordered and before they could be reviewed, patient eloped from the emergency room removing her IV. Patient is welcome to return for further care and management.  She was of sound mind to make a decision regarding medical care.      Clinical Impression:  1. Generalized abdominal pain      Eloped   Final Clinical Impression(s) / ED Diagnoses Final diagnoses:  Generalized abdominal pain    Rx / DC Orders ED Discharge Orders     None         Mariah Ade, MD 06/01/23 2317

## 2023-06-01 NOTE — ED Notes (Signed)
Pt had another nurse call this paramedic over to her because she said her ride was here and she needed to go. She told me to disconnect her IV's and started ripping her cords off. Pt did allow this paramedic to remove her IV then exited the premises stating she could not wait any longer. PT advised she is leaving against Medical Advise. EDP made aware. Pt refused vitals as well as signing AMA paperwork.

## 2023-06-01 NOTE — ED Triage Notes (Signed)
Pt arrives with c/o n/v that started 5+ days ago. PT has been here multiple times in the past few days, but has left due to the wait times. Pt reports decreased PO intake. PT denies fevers. Pt states she believes she is "dehydrated."

## 2023-11-13 ENCOUNTER — Observation Stay (HOSPITAL_COMMUNITY): Payer: Self-pay

## 2023-11-13 ENCOUNTER — Inpatient Hospital Stay (HOSPITAL_COMMUNITY)
Admission: EM | Admit: 2023-11-13 | Discharge: 2023-11-15 | DRG: 392 | Payer: Self-pay | Attending: Internal Medicine | Admitting: Internal Medicine

## 2023-11-13 ENCOUNTER — Encounter (HOSPITAL_COMMUNITY): Payer: Self-pay

## 2023-11-13 ENCOUNTER — Other Ambulatory Visit: Payer: Self-pay

## 2023-11-13 DIAGNOSIS — R197 Diarrhea, unspecified: Secondary | ICD-10-CM | POA: Diagnosis present

## 2023-11-13 DIAGNOSIS — Z1152 Encounter for screening for COVID-19: Secondary | ICD-10-CM

## 2023-11-13 DIAGNOSIS — R946 Abnormal results of thyroid function studies: Secondary | ICD-10-CM | POA: Diagnosis present

## 2023-11-13 DIAGNOSIS — R112 Nausea with vomiting, unspecified: Principal | ICD-10-CM | POA: Diagnosis present

## 2023-11-13 DIAGNOSIS — R1032 Left lower quadrant pain: Secondary | ICD-10-CM | POA: Diagnosis present

## 2023-11-13 DIAGNOSIS — B349 Viral infection, unspecified: Secondary | ICD-10-CM | POA: Diagnosis not present

## 2023-11-13 DIAGNOSIS — E876 Hypokalemia: Secondary | ICD-10-CM | POA: Diagnosis present

## 2023-11-13 DIAGNOSIS — R1031 Right lower quadrant pain: Secondary | ICD-10-CM | POA: Diagnosis present

## 2023-11-13 LAB — CBC WITH DIFFERENTIAL/PLATELET
Abs Immature Granulocytes: 0.02 K/uL (ref 0.00–0.07)
Basophils Absolute: 0 K/uL (ref 0.0–0.1)
Basophils Relative: 1 %
Eosinophils Absolute: 0 K/uL (ref 0.0–0.5)
Eosinophils Relative: 0 %
HCT: 38.5 % (ref 36.0–46.0)
Hemoglobin: 12.2 g/dL (ref 12.0–15.0)
Immature Granulocytes: 0 %
Lymphocytes Relative: 20 %
Lymphs Abs: 1.7 K/uL (ref 0.7–4.0)
MCH: 27.4 pg (ref 26.0–34.0)
MCHC: 31.7 g/dL (ref 30.0–36.0)
MCV: 86.5 fL (ref 80.0–100.0)
Monocytes Absolute: 0.6 K/uL (ref 0.1–1.0)
Monocytes Relative: 7 %
Neutro Abs: 6.3 K/uL (ref 1.7–7.7)
Neutrophils Relative %: 72 %
Platelets: 250 K/uL (ref 150–400)
RBC: 4.45 MIL/uL (ref 3.87–5.11)
RDW: 13.1 % (ref 11.5–15.5)
WBC: 8.7 K/uL (ref 4.0–10.5)
nRBC: 0 % (ref 0.0–0.2)

## 2023-11-13 LAB — COMPREHENSIVE METABOLIC PANEL WITH GFR
ALT: 15 U/L (ref 0–44)
AST: 19 U/L (ref 15–41)
Albumin: 3.9 g/dL (ref 3.5–5.0)
Alkaline Phosphatase: 36 U/L — ABNORMAL LOW (ref 38–126)
Anion gap: 12 (ref 5–15)
BUN: 10 mg/dL (ref 6–20)
CO2: 22 mmol/L (ref 22–32)
Calcium: 9.3 mg/dL (ref 8.9–10.3)
Chloride: 110 mmol/L (ref 98–111)
Creatinine, Ser: 0.92 mg/dL (ref 0.44–1.00)
GFR, Estimated: >60 mL/min (ref >60)
Glucose, Bld: 101 mg/dL — ABNORMAL HIGH (ref 70–99)
Potassium: 3.1 mmol/L — ABNORMAL LOW (ref 3.5–5.1)
Sodium: 144 mmol/L (ref 135–145)
Total Bilirubin: 0.7 mg/dL (ref 0.0–1.2)
Total Protein: 7.2 g/dL (ref 6.5–8.1)

## 2023-11-13 LAB — RESP PANEL BY RT-PCR (RSV, FLU A&B, COVID)  RVPGX2
Influenza A by PCR: NEGATIVE
Influenza B by PCR: NEGATIVE
Resp Syncytial Virus by PCR: NEGATIVE
SARS Coronavirus 2 by RT PCR: NEGATIVE

## 2023-11-13 LAB — HCG, SERUM, QUALITATIVE: Preg, Serum: NEGATIVE

## 2023-11-13 LAB — RAPID URINE DRUG SCREEN, HOSP PERFORMED
Amphetamines: NOT DETECTED
Barbiturates: NOT DETECTED
Benzodiazepines: NOT DETECTED
Cocaine: POSITIVE — AB
Opiates: NOT DETECTED
Tetrahydrocannabinol: POSITIVE — AB

## 2023-11-13 LAB — MAGNESIUM: Magnesium: 1.7 mg/dL (ref 1.7–2.4)

## 2023-11-13 LAB — LIPASE, BLOOD: Lipase: 29 U/L (ref 11–51)

## 2023-11-13 MED ORDER — KETOROLAC TROMETHAMINE 30 MG/ML IJ SOLN
30.0000 mg | Freq: Four times a day (QID) | INTRAMUSCULAR | Status: DC | PRN
  Administered 2023-11-13: 30 mg via INTRAVENOUS
  Filled 2023-11-13: qty 1

## 2023-11-13 MED ORDER — POTASSIUM CHLORIDE 10 MEQ/100ML IV SOLN
10.0000 meq | Freq: Once | INTRAVENOUS | Status: AC
  Administered 2023-11-13: 10 meq via INTRAVENOUS
  Filled 2023-11-13: qty 100

## 2023-11-13 MED ORDER — SODIUM CHLORIDE 0.9 % IV BOLUS
1000.0000 mL | Freq: Once | INTRAVENOUS | Status: AC
  Administered 2023-11-13: 1000 mL via INTRAVENOUS

## 2023-11-13 MED ORDER — FAMOTIDINE IN NACL 20-0.9 MG/50ML-% IV SOLN
20.0000 mg | Freq: Once | INTRAVENOUS | Status: AC
  Administered 2023-11-13: 20 mg via INTRAVENOUS
  Filled 2023-11-13: qty 50

## 2023-11-13 MED ORDER — ACETAMINOPHEN 650 MG RE SUPP: 650.0000 mg | Freq: Four times a day (QID) | RECTAL | Status: DC | PRN

## 2023-11-13 MED ORDER — ONDANSETRON HCL 4 MG PO TABS: 4.0000 mg | ORAL_TABLET | Freq: Four times a day (QID) | ORAL | Status: DC | PRN

## 2023-11-13 MED ORDER — POTASSIUM CHLORIDE 2 MEQ/ML IV SOLN
INTRAVENOUS | Status: DC
  Filled 2023-11-13 (×2): qty 1000

## 2023-11-13 MED ORDER — DROPERIDOL 2.5 MG/ML IJ SOLN
1.2500 mg | Freq: Once | INTRAMUSCULAR | Status: AC
  Administered 2023-11-13: 1.25 mg via INTRAVENOUS
  Filled 2023-11-13: qty 2

## 2023-11-13 MED ORDER — KCL IN DEXTROSE-NACL 40-5-0.45 MEQ/L-%-% IV SOLN
INTRAVENOUS | Status: AC
  Filled 2023-11-13 (×2): qty 1000

## 2023-11-13 MED ORDER — PROMETHAZINE (PHENERGAN) 6.25MG IN NS 50ML IVPB: 6.2500 mg | Freq: Four times a day (QID) | INTRAVENOUS | Status: DC | PRN

## 2023-11-13 MED ORDER — ACETAMINOPHEN 325 MG PO TABS
650.0000 mg | ORAL_TABLET | Freq: Four times a day (QID) | ORAL | Status: DC | PRN
  Administered 2023-11-14 – 2023-11-15 (×2): 650 mg via ORAL
  Filled 2023-11-13 (×2): qty 2

## 2023-11-13 MED ORDER — SODIUM CHLORIDE 0.9 % IV SOLN
12.5000 mg | Freq: Once | INTRAVENOUS | Status: AC
  Administered 2023-11-13: 12.5 mg via INTRAVENOUS
  Filled 2023-11-13: qty 12.5

## 2023-11-13 MED ORDER — ONDANSETRON HCL 4 MG/2ML IJ SOLN
4.0000 mg | Freq: Four times a day (QID) | INTRAMUSCULAR | Status: DC | PRN
  Administered 2023-11-15: 4 mg via INTRAVENOUS
  Filled 2023-11-13: qty 2

## 2023-11-13 MED ORDER — ONDANSETRON HCL 4 MG/2ML IJ SOLN
4.0000 mg | Freq: Once | INTRAMUSCULAR | Status: AC
  Administered 2023-11-13: 4 mg via INTRAVENOUS
  Filled 2023-11-13: qty 2

## 2023-11-13 MED ORDER — POTASSIUM CHLORIDE 2 MEQ/ML IV SOLN: INTRAVENOUS | Status: DC

## 2023-11-13 MED ORDER — ENOXAPARIN SODIUM 40 MG/0.4ML IJ SOSY
40.0000 mg | PREFILLED_SYRINGE | INTRAMUSCULAR | Status: DC
  Administered 2023-11-13 – 2023-11-14 (×2): 40 mg via SUBCUTANEOUS
  Filled 2023-11-13 (×2): qty 0.4

## 2023-11-13 MED ORDER — IOHEXOL 350 MG/ML SOLN
75.0000 mL | Freq: Once | INTRAVENOUS | Status: AC | PRN
  Administered 2023-11-13: 75 mL via INTRAVENOUS

## 2023-11-13 MED ORDER — KETOROLAC TROMETHAMINE 30 MG/ML IJ SOLN
30.0000 mg | Freq: Once | INTRAMUSCULAR | Status: AC
  Administered 2023-11-13: 30 mg via INTRAVENOUS
  Filled 2023-11-13: qty 1

## 2023-11-13 NOTE — ED Notes (Signed)
 Transport called. Mariah Leonard on his way for pickup

## 2023-11-13 NOTE — Plan of Care (Signed)

## 2023-11-13 NOTE — ED Triage Notes (Signed)
 Pt coming from jail with sherriff for n/v/d, abd pain and low potassium for the past 2 days.

## 2023-11-13 NOTE — ED Provider Notes (Signed)
 [place] EMERGENCY DEPARTMENT AT [facility] Provider Note   CSN: [id number] Arrival date & time: 11/13/23  1102     Patient presents with: Abdominal Pain, Emesis, and Abnormal Lab   Mariah Leonard is a 28 y.o. female presenting with n/v/diarrhea x 2 days.  Difficulty keeping down food or water.  No fever, cough, headache.  Here in custody with sherriff's department  Hx of CHS in the past, seen in ED for this No hx of abd surgeries   HPI     Prior to Admission medications   Not on File    Allergies: Patient has no known allergies.    Review of Systems  Updated Vital Signs BP (!) 134/90   Pulse 62   Temp (!) 97.3 F (36.3 C) (Oral)   Resp (!) 21   Ht 5' 5 (1.651 m)   Wt 58.5 kg   LMP 10/30/2023 (Approximate)   SpO2 100%   BMI 21.47 kg/m   Physical Exam Constitutional:      General: She is not in acute distress. HENT:     Head: Normocephalic and atraumatic.  Eyes:     Conjunctiva/sclera: Conjunctivae normal.  Cardiovascular:     Rate and Rhythm: Normal rate and regular rhythm.  Pulmonary:     Effort: Pulmonary effort is normal. No respiratory distress.  Abdominal:     General: There is no distension.     Tenderness: There is generalized abdominal tenderness.  Skin:    General: Skin is warm and dry.  Neurological:     General: No focal deficit present.     Mental Status: She is alert. Mental status is at baseline.  Psychiatric:        Mood and Affect: Mood normal.        Behavior: Behavior normal.     (all labs ordered are listed, but only abnormal results are displayed) Labs Reviewed  COMPREHENSIVE METABOLIC PANEL WITH GFR - Abnormal; Notable for the following components:      Result Value   Potassium 3.1 (*)    Glucose, Bld 101 (*)    Alkaline Phosphatase 36 (*)    All other components within normal limits  RAPID URINE DRUG SCREEN, HOSP PERFORMED - Abnormal; Notable for the following components:   Cocaine POSITIVE (*)     Tetrahydrocannabinol POSITIVE (*)    All other components within normal limits  RESP PANEL BY RT-PCR (RSV, FLU A&B, COVID)  RVPGX2  CBC WITH DIFFERENTIAL/PLATELET  LIPASE, BLOOD  HCG, SERUM, QUALITATIVE  MAGNESIUM   URINALYSIS, ROUTINE W REFLEX MICROSCOPIC    EKG: None  Radiology: No results found.   Procedures   Medications Ordered in the ED  sodium chloride  0.9 % bolus 1,000 mL (0 mLs Intravenous Stopped 11/13/23 1337)  droperidol  (INAPSINE ) 2.5 MG/ML injection 1.25 mg (1.25 mg Intravenous Given 11/13/23 1201)  famotidine  (PEPCID ) IVPB 20 mg premix (0 mg Intravenous Stopped 11/13/23 1337)  ketorolac  (TORADOL ) 30 MG/ML injection 30 mg (30 mg Intravenous Given 11/13/23 1158)  potassium chloride  10 mEq in 100 mL IVPB (0 mEq Intravenous Stopped 11/13/23 1522)  promethazine  (PHENERGAN ) 12.5 mg in sodium chloride  0.9 % 50 mL IVPB (0 mg Intravenous Stopped 11/13/23 1521)  ondansetron  (ZOFRAN ) injection 4 mg (4 mg Intravenous Given 11/13/23 1541)    Clinical Course as of 11/13/23 1547  Fri Nov 13, 2023  1309 Pain improved but still nauseated/vomiting.  Phenergan  ordered [MT]  1513 Still vomiting, will try zofran  now and check ecg qt  interval [MT]  1545 Still persistently vomiting/dry heaving.  Will move to admit at this time for likely CHS.  Pt remains in custody [MT]    Clinical Course User Index [MT] Croy Drumwright, Donnice PARAS, MD                                 Medical Decision Making Amount and/or Complexity of Data Reviewed Labs: ordered. ECG/medicine tests: ordered.  Risk Prescription drug management.   This patient presents to the ED with concern for abdominal pain. This involves an extensive number of treatment options, and is a complaint that carries with it a high risk of complications and morbidity.  The differential diagnosis includes gastritis vs CHS vs viral illness vs intraabdominal infection or inflammatory process vs other  Co-morbidities that complicate the patient evaluation:  hx of CHS  I ordered and personally interpreted labs.  The pertinent results include:  K 3.1, Cr wnl.  UDS + cocaine and THC  The patient was maintained on a cardiac monitor.  I personally viewed and interpreted the cardiac monitored which showed an underlying rhythm of: NSR I ordered medication including IV anti-emetics and IV fluids  I have reviewed the patients home medicines and have made adjustments as needed  Test Considered: no indication for ct abdomen, doubt acute surgical or infectious etiology; I suspect this is CHS or gastroparesis  After the interventions noted above, I reevaluated the patient and found that they have: stayed the same - still vomiting  Disposition:  After consideration of the diagnostic results and the patient's response to treatment, I feel that the patient would benefit from medical admission.      Final diagnoses:  Nausea and vomiting, unspecified vomiting type    ED Discharge Orders     None          Cottie Donnice PARAS, MD 11/13/23 [phone]

## 2023-11-13 NOTE — H&P (Signed)
 History and Physical    Mariah Leonard, Mariah Leonard DOA: 11/13/2023  I have briefly reviewed the patient's prior medical records in [facility]  PCP: Patient, No Pcp Per  Patient coming from: home (jail)  Chief Complaint: Intractable nausea, vomiting, abdominal pain  HPI: Mariah Leonard is a 28 y.o. female with medical history significant of no significant past medical history, no medications, comes into the hospital with abdominal pain, intractable nausea, vomiting, diarrhea.  She tells me this has been going on for the past 2 days.  She denies any fever or chills.  She tells me that her emesis has been yellow, but has been having some blood streaks last few times.  She denies any blood in her stools.  Her abdominal pain is mainly in the lower quadrants.  She is not on any prescription medications, but tells me that she smokes, uses marijuana as well as cocaine.  Most recent time she used was about 6 days ago.  She tells me she has been having several episodes this year alone, this is her third one.  She reports occasional milder symptoms in between these episodes  ED Course: In the emergency room she is afebrile, normal/hypertensive, satting well on room air.  Blood work reveals normal CBC, low potassium at 3.1, glucose of 101.  UDS shows marijuana and cocaine.  She was treated conservatively in the ED with pain medications, antiemetics, she has persistent symptoms and we are asked to admit  Review of Systems: All systems reviewed, and apart from HPI, all negative  Past Medical History:  Diagnosis Date   Infection    UTI    Past Surgical History:  Procedure Laterality Date   NO PAST SURGERIES       reports that she has never smoked. She has never used smokeless tobacco. She reports that she does not drink alcohol and does not use drugs.  No Known Allergies  History reviewed. No pertinent family history.  Prior to Admission medications   Not on File     Physical Exam: Vitals:   11/13/23 1230 11/13/23 1330 11/13/23 1500 11/13/23 1558  BP:   (!) 134/90   Pulse: (!) 57 (!) 44 62   Resp: (!) 23 (!) 27 (!) 21   Temp:    97.6 F (36.4 C)  TempSrc:    Oral  SpO2: 100% 100% 100%   Weight:      Height:          Constitutional: NAD, calm, comfortable Eyes: PERRL, lids and conjunctivae normal ENMT: Mucous membranes are moist. Neck: normal, supple Respiratory: clear to auscultation bilaterally, no wheezing, no crackles. Normal respiratory effort.  Cardiovascular: Regular rate and rhythm, no murmurs / rubs / gallops. No extremity edema.  Abdomen: Very tender with one press, patient refused exam further. BS + Musculoskeletal: no clubbing / cyanosis. Normal muscle tone.  Skin: no rashes, lesions, ulcers. No induration Neurologic: non focal   Labs on Admission: I have personally reviewed following labs and imaging studies  CBC: Recent Labs  Lab 11/13/23 1142  WBC 8.7  NEUTROABS 6.3  HGB 12.2  HCT 38.5  MCV 86.5  PLT 250   Basic Metabolic Panel: Recent Labs  Lab 11/13/23 1142  NA 144  K 3.1*  CL 110  CO2 22  GLUCOSE 101*  BUN 10  CREATININE 0.92  CALCIUM  9.3  MG 1.7   Liver Function Tests: Recent Labs  Lab 11/13/23 1142  AST 19  ALT  15  ALKPHOS 36*  BILITOT 0.7  PROT 7.2  ALBUMIN 3.9   Coagulation Profile: No results for input(s): INR, PROTIME in the last 168 hours. BNP (last 3 results) No results for input(s): PROBNP in the last 8760 hours. CBG: No results for input(s): GLUCAP in the last 168 hours. Thyroid Function Tests: No results for input(s): TSH, T4TOTAL, FREET4, T3FREE, THYROIDAB in the last 72 hours. Urine analysis:    Component Value Date/Time   COLORURINE YELLOW 01/Leonard/2025 1518   APPEARANCEUR HAZY (A) 01/Leonard/2025 1518   LABSPEC 1.003 (L) 01/Leonard/2025 1518   PHURINE 7.0 01/Leonard/2025 1518   GLUCOSEU NEGATIVE 01/Leonard/2025 1518   HGBUR MODERATE (A) 01/Leonard/2025 1518   HGBUR  trace-intact 12/19/2008 1107   BILIRUBINUR NEGATIVE 01/Leonard/2025 1518   BILIRUBINUR neg 03/16/2020 1144   KETONESUR NEGATIVE 01/Leonard/2025 1518   PROTEINUR NEGATIVE 01/Leonard/2025 1518   UROBILINOGEN 0.2 03/16/2020 1144   UROBILINOGEN 1.0 10/19/2014 1301   NITRITE NEGATIVE 01/Leonard/2025 1518   LEUKOCYTESUR LARGE (A) 01/Leonard/2025 1518    Assessment/Plan Principal problem Intractable nausea, vomiting, abdominal pain -patient has had this episode in the past, possibly marijuana induced.  She is very tender, has been having significant diarrhea which may also point to food poisoning - Has been having several episodes this year, she has had no imaging recently, will obtain a CT scan of the abdomen and pelvis - Supportive care n.p.o., IV fluids, Toradol  for pain, antiemetics.  Advance diet as tolerated - Urinalysis pending  Active problems Polysubstance use -tobacco, cocaine, marijuana.  Counseled for cessation  Hypokalemia-will replenish potassium  DVT prophylaxis: Lovenox   Code Status: Full code  Family Communication: no family at bedside Disposition Plan: home / jail when ready  Bed Type: medsurg Consults called: none  Obs/Inp: Obs   Nilda Fendt, MD, PhD [facility]  Contact via [email or web address]  11/13/2023, 4:14 PM

## 2023-11-14 LAB — COMPREHENSIVE METABOLIC PANEL WITH GFR
ALT: 13 U/L (ref 0–44)
AST: 18 U/L (ref 15–41)
Albumin: 3.6 g/dL (ref 3.5–5.0)
Alkaline Phosphatase: 32 U/L — ABNORMAL LOW (ref 38–126)
Anion gap: 10 (ref 5–15)
BUN: 10 mg/dL (ref 6–20)
CO2: 20 mmol/L — ABNORMAL LOW (ref 22–32)
Calcium: 8.8 mg/dL — ABNORMAL LOW (ref 8.9–10.3)
Chloride: 112 mmol/L — ABNORMAL HIGH (ref 98–111)
Creatinine, Ser: 0.99 mg/dL (ref 0.44–1.00)
GFR, Estimated: >60 mL/min (ref >60)
Glucose, Bld: 122 mg/dL — ABNORMAL HIGH (ref 70–99)
Potassium: 3.3 mmol/L — ABNORMAL LOW (ref 3.5–5.1)
Sodium: 142 mmol/L (ref 135–145)
Total Bilirubin: 0.6 mg/dL (ref 0.0–1.2)
Total Protein: 6.5 g/dL (ref 6.5–8.1)

## 2023-11-14 LAB — URINALYSIS, ROUTINE W REFLEX MICROSCOPIC
Bacteria, UA: NONE SEEN
Bilirubin Urine: NEGATIVE
Glucose, UA: NEGATIVE mg/dL
Ketones, ur: NEGATIVE mg/dL
Nitrite: NEGATIVE
Protein, ur: NEGATIVE mg/dL
Specific Gravity, Urine: 1.008 (ref 1.005–1.030)
pH: 6 (ref 5.0–8.0)

## 2023-11-14 LAB — CBC
HCT: 34.1 % — ABNORMAL LOW (ref 36.0–46.0)
Hemoglobin: 11 g/dL — ABNORMAL LOW (ref 12.0–15.0)
MCH: 27 pg (ref 26.0–34.0)
MCHC: 32.3 g/dL (ref 30.0–36.0)
MCV: 83.8 fL (ref 80.0–100.0)
Platelets: 227 K/uL (ref 150–400)
RBC: 4.07 MIL/uL (ref 3.87–5.11)
RDW: 13.1 % (ref 11.5–15.5)
WBC: 10.8 K/uL — ABNORMAL HIGH (ref 4.0–10.5)
nRBC: 0 % (ref 0.0–0.2)

## 2023-11-14 LAB — HEMOGLOBIN A1C
Hgb A1c MFr Bld: 5.8 % — ABNORMAL HIGH (ref 4.8–5.6)
Mean Plasma Glucose: 119.76 mg/dL

## 2023-11-14 LAB — TSH: TSH: 0.025 u[IU]/mL — ABNORMAL LOW (ref 0.350–4.500)

## 2023-11-14 LAB — T4, FREE: Free T4: 0.79 ng/dL (ref 0.61–1.12)

## 2023-11-14 LAB — HIV ANTIBODY (ROUTINE TESTING W REFLEX): HIV Screen 4th Generation wRfx: NONREACTIVE

## 2023-11-14 MED ORDER — LOPERAMIDE HCL 2 MG PO CAPS
2.0000 mg | ORAL_CAPSULE | Freq: Once | ORAL | Status: AC
  Administered 2023-11-14: 2 mg via ORAL
  Filled 2023-11-14: qty 1

## 2023-11-14 MED ORDER — KCL IN DEXTROSE-NACL 40-5-0.45 MEQ/L-%-% IV SOLN
INTRAVENOUS | Status: AC
  Filled 2023-11-14 (×2): qty 1000

## 2023-11-14 MED ORDER — PROCHLORPERAZINE EDISYLATE 10 MG/2ML IJ SOLN
10.0000 mg | INTRAMUSCULAR | Status: DC | PRN
  Administered 2023-11-14: 10 mg via INTRAVENOUS
  Filled 2023-11-14: qty 2

## 2023-11-14 MED ORDER — HYDROMORPHONE HCL 1 MG/ML IJ SOLN
0.5000 mg | INTRAMUSCULAR | Status: DC | PRN
  Filled 2023-11-14: qty 1

## 2023-11-14 NOTE — Plan of Care (Signed)

## 2023-11-14 NOTE — Progress Notes (Signed)
 PROGRESS NOTE  Mariah Leonard FMW:[id number] DOB: February 01, 1996 DOA: 11/13/2023 PCP: Patient, No Pcp Per   LOS: 0 days   Brief Narrative / Interim history: 28 y.o. female with medical history significant of no significant past medical history, no medications, comes into the hospital with abdominal pain, intractable nausea, vomiting, diarrhea.  She failed conservative management in the emergency room and was admitted to the hospital  Subjective / 24h Interval events: Continues to have persistent diarrhea overnight, as well as repeated episodes of emesis.  Complains of severe abdominal pain  Assesement and Plan: Principal problem Intractable nausea, vomiting, abdominal pain -patient has had this episode in the past, possibly marijuana induced.  She is very tender, has been having significant diarrhea which may also point to potential food poisoning - CT scan of the abdomen and pelvis showed nonspecific mild periportal edema, however her LFTs are normal so less likely to be clinically significant - Continue supportive care, change antiemetics to Compazine  since Phenergan  is not working, continue IV fluids, pain medications - For unknown reasons, urinalysis still pending this morning   Active problems Polysubstance use -tobacco, cocaine, marijuana.  Counseled for cessation   Hypokalemia-need to replace potassium  Abnormal TSH-Free T4 is normal, will need repeat TSH in few weeks and once she is back to baseline  Scheduled Meds:  enoxaparin  (LOVENOX ) injection  40 mg Subcutaneous Q24H   Continuous Infusions: PRN Meds:.acetaminophen  **OR** acetaminophen , HYDROmorphone  (DILAUDID ) injection, ketorolac , ondansetron  **OR** ondansetron  (ZOFRAN ) IV, prochlorperazine   No current outpatient medications  Diet Orders (From admission, onward)     Start     Ordered   11/13/23 2133  Diet clear liquid Room service appropriate? Yes; Fluid consistency: Thin  Diet effective now       Question Answer  Comment  Room service appropriate? Yes   Fluid consistency: Thin      11/13/23 2132            DVT prophylaxis: enoxaparin  (LOVENOX ) injection 40 mg Start: 11/13/23 1945   Lab Results  Component Value Date   PLT 227 11/14/2023      Code Status: Full Code  Family Communication: no family at bedside   Status is: Observation The patient will require care spanning > 2 midnights and should be moved to inpatient because: persistent symptoms   Level of care: Med-Surg  Consultants:  none  Objective: Vitals:   11/13/23 1944 11/14/23 0452 11/14/23 0733 11/14/23 0823  BP: (!) 147/99 (!) 148/89 138/85 123/78  Pulse: (!) 53 (!) 50 62 62  Resp: 18 18 17 18   Temp: 100.1 F (37.8 C) 100.1 F (37.8 C) 97.9 F (36.6 C) 99.9 F (37.7 C)  TempSrc: Oral Oral Oral   SpO2: 100% 99% 99% 100%  Weight:      Height:        Intake/Output Summary (Last 24 hours) at 11/14/2023 0921 Last data filed at 11/14/2023 9561 Gross per 24 hour  Intake 2105.24 ml  Output --  Net 2105.24 ml   Wt Readings from Last 3 Encounters:  11/13/23 58.5 kg  06/01/23 59 kg  05/29/23 59 kg    Examination:  Constitutional: NAD Eyes: no scleral icterus ENMT: Mucous membranes are moist.  Neck: normal, supple Respiratory: clear to auscultation bilaterally, no wheezing, no crackles.  Cardiovascular: Regular rate and rhythm, no murmurs / rubs / gallops. No LE edema.  Abdomen: non distended, no tenderness. Bowel sounds positive.  Musculoskeletal: no clubbing / cyanosis.   Data Reviewed: I have independently  reviewed following labs and imaging studies   CBC Recent Labs  Lab 11/13/23 1142 11/14/23 0552  WBC 8.7 10.8*  HGB 12.2 11.0*  HCT 38.5 34.1*  PLT 250 227  MCV 86.5 83.8  MCH 27.4 27.0  MCHC 31.7 32.3  RDW 13.1 13.1  LYMPHSABS 1.7  --   MONOABS 0.6  --   EOSABS 0.0  --   BASOSABS 0.0  --     Recent Labs  Lab 11/13/23 1142 11/14/23 0552  NA 144 142  K 3.1* 3.3*  CL 110 112*  CO2  22 20*  GLUCOSE 101* 122*  BUN 10 10  CREATININE 0.92 0.99  CALCIUM  9.3 8.8*  AST 19 18  ALT 15 13  ALKPHOS 36* 32*  BILITOT 0.7 0.6  ALBUMIN 3.9 3.6  MG 1.7  --   TSH  --  0.025*  HGBA1C  --  5.8*    ------------------------------------------------------------------------------------------------------------------ No results for input(s): CHOL, HDL, LDLCALC, TRIG, CHOLHDL, LDLDIRECT in the last 72 hours.  Lab Results  Component Value Date   HGBA1C 5.8 (H) 11/14/2023   ------------------------------------------------------------------------------------------------------------------ Recent Labs    11/14/23 0552  TSH 0.025*    Cardiac Enzymes No results for input(s): CKMB, TROPONINI, MYOGLOBIN in the last 168 hours.  Invalid input(s): CK ------------------------------------------------------------------------------------------------------------------ No results found for: BNP  CBG: No results for input(s): GLUCAP in the last 168 hours.  Recent Results (from the past 240 hours)  Resp panel by RT-PCR (RSV, Flu A&B, Covid) Anterior Nasal Swab     Status: None   Collection Time: 11/13/23 11:42 AM   Specimen: Anterior Nasal Swab  Result Value Ref Range Status   SARS Coronavirus 2 by RT PCR NEGATIVE NEGATIVE Final   Influenza A by PCR NEGATIVE NEGATIVE Final   Influenza B by PCR NEGATIVE NEGATIVE Final    Comment: (NOTE) The Xpert Xpress SARS-CoV-2/FLU/RSV plus assay is intended as an aid in the diagnosis of influenza from Nasopharyngeal swab specimens and should not be used as a sole basis for treatment. Nasal washings and aspirates are unacceptable for Xpert Xpress SARS-CoV-2/FLU/RSV testing.  Fact Sheet for Patients: BloggerCourse.com  Fact Sheet for Healthcare Providers: SeriousBroker.it  This test is not yet approved or cleared by the [place]  FDA and has been authorized for  detection and/or diagnosis of SARS-CoV-2 by FDA under an Emergency Use Authorization (EUA). This EUA will remain in effect (meaning this test can be used) for the duration of the COVID-19 declaration under Section 564(b)(1) of the Act, 21 U.S.C. section 360bbb-3(b)(1), unless the authorization is terminated or revoked.     Resp Syncytial Virus by PCR NEGATIVE NEGATIVE Final    Comment: (NOTE) Fact Sheet for Patients: BloggerCourse.com  Fact Sheet for Healthcare Providers: SeriousBroker.it  This test is not yet approved or cleared by the [place]  FDA and has been authorized for detection and/or diagnosis of SARS-CoV-2 by FDA under an Emergency Use Authorization (EUA). This EUA will remain in effect (meaning this test can be used) for the duration of the COVID-19 declaration under Section 564(b)(1) of the Act, 21 U.S.C. section 360bbb-3(b)(1), unless the authorization is terminated or revoked.  Performed at [facility], [address] [place], [place] [address]      Radiology Studies: CT ABDOMEN PELVIS W CONTRAST Result Date: 11/13/2023 CLINICAL DATA:  Abdominal pain EXAM: CT ABDOMEN AND PELVIS WITH CONTRAST TECHNIQUE: Multidetector CT imaging of the abdomen and pelvis was performed using the standard protocol following bolus administration of intravenous  contrast. RADIATION DOSE REDUCTION: This exam was performed according to the departmental dose-optimization program which includes automated exposure control, adjustment of the mA and/or kV according to patient size and/or use of iterative reconstruction technique. CONTRAST:  75mL OMNIPAQUE  IOHEXOL  350 MG/ML SOLN COMPARISON:  10/18/2012 FINDINGS: Lower chest: No acute abnormality Hepatobiliary: No focal hepatic abnormality. Mild periportal edema. Gallbladder unremarkable. Pancreas: No focal abnormality or ductal dilatation. Spleen: No focal abnormality.  Normal size.  Adrenals/Urinary Tract: No adrenal abnormality. No focal renal abnormality. No stones or hydronephrosis. Urinary bladder is unremarkable. Stomach/Bowel: Stomach, large and small bowel grossly unremarkable. Normal appendix. Vascular/Lymphatic: No evidence of aneurysm or adenopathy. Reproductive: Uterus and adnexa unremarkable.  No mass. Other: No free fluid or free air. Musculoskeletal: No acute bony abnormality. IMPRESSION: Mild periportal edema noted within the liver, nonspecific finding. This could be related to volume overload or inflammatory processes such as hepatitis. Recommend correlation with LFTs. Electronically Signed   By: Franky Crease M.D.   On: 11/13/2023 17:31     Nilda Fendt, MD, PhD [facility]  Between 7 am - 7 pm I am available, please contact me via Amion (for emergencies) or Securechat (non urgent messages)  Between 7 pm - 7 am I am not available, please contact night coverage MD/APP via Amion

## 2023-11-15 LAB — COMPREHENSIVE METABOLIC PANEL WITH GFR
ALT: 14 U/L (ref 0–44)
AST: 16 U/L (ref 15–41)
Albumin: 3.4 g/dL — ABNORMAL LOW (ref 3.5–5.0)
Alkaline Phosphatase: 30 U/L — ABNORMAL LOW (ref 38–126)
Anion gap: 11 (ref 5–15)
BUN: <5 mg/dL — ABNORMAL LOW (ref 6–20)
CO2: 22 mmol/L (ref 22–32)
Calcium: 8.9 mg/dL (ref 8.9–10.3)
Chloride: 106 mmol/L (ref 98–111)
Creatinine, Ser: 0.78 mg/dL (ref 0.44–1.00)
GFR, Estimated: >60 mL/min (ref >60)
Glucose, Bld: 128 mg/dL — ABNORMAL HIGH (ref 70–99)
Potassium: 3.8 mmol/L (ref 3.5–5.1)
Sodium: 139 mmol/L (ref 135–145)
Total Bilirubin: 0.6 mg/dL (ref 0.0–1.2)
Total Protein: 6.7 g/dL (ref 6.5–8.1)

## 2023-11-15 LAB — RESPIRATORY PANEL BY PCR

## 2023-11-15 LAB — CBC
HCT: 34 % — ABNORMAL LOW (ref 36.0–46.0)
Hemoglobin: 11.3 g/dL — ABNORMAL LOW (ref 12.0–15.0)
MCH: 28 pg (ref 26.0–34.0)
MCHC: 33.2 g/dL (ref 30.0–36.0)
MCV: 84.2 fL (ref 80.0–100.0)
Platelets: 205 K/uL (ref 150–400)
RBC: 4.04 MIL/uL (ref 3.87–5.11)
RDW: 12.9 % (ref 11.5–15.5)
WBC: 8.2 K/uL (ref 4.0–10.5)
nRBC: 0 % (ref 0.0–0.2)

## 2023-11-15 LAB — URINALYSIS, ROUTINE W REFLEX MICROSCOPIC
Glucose, UA: NEGATIVE mg/dL
Ketones, ur: NEGATIVE mg/dL
Nitrite: NEGATIVE
Protein, ur: 30 mg/dL — AB
Specific Gravity, Urine: 1.008 (ref 1.005–1.030)
pH: 7 (ref 5.0–8.0)

## 2023-11-15 LAB — SARS CORONAVIRUS 2 BY RT PCR: SARS Coronavirus 2 by RT PCR: NEGATIVE

## 2023-11-15 LAB — MAGNESIUM: Magnesium: 1.7 mg/dL (ref 1.7–2.4)

## 2023-11-15 LAB — CORTISOL-AM, BLOOD: Cortisol - AM: 16.6 ug/dL (ref 6.7–22.6)

## 2023-11-15 MED ORDER — PROCHLORPERAZINE MALEATE 5 MG PO TABS: 5.0000 mg | ORAL_TABLET | Freq: Four times a day (QID) | ORAL | 0 refills | Status: AC | PRN

## 2023-11-15 NOTE — Progress Notes (Signed)
 Assumed care at 1900. Pt has been discharge at 2000. [facility]. All discharge paperwork has been given per [facility] RN.

## 2023-11-15 NOTE — Discharge Summary (Signed)
 Physician Discharge Summary  Mariah Leonard FMW:[id number] DOB: [birth year] DOA: 11/13/2023  PCP: Patient, No Pcp Per  Admit date: 11/13/2023 Discharge date: 11/15/2023  Admitted From: home (jail) Disposition:  home (jail)  Recommendations for Outpatient Follow-up:  Follow up with PCP in 1-2 weeks Please obtain BMP/CBC in one week Please follow up on the following pending results: blood and urine cultures  Home Health: none Equipment/Devices: none  Discharge Condition: stable CODE STATUS: Full code Diet Orders (From admission, onward)     Start     Ordered   11/15/23 0918  Diet regular Room service appropriate? Yes; Fluid consistency: Thin  Diet effective now       Question Answer Comment  Room service appropriate? Yes   Fluid consistency: Thin      11/15/23 0917            Brief Narrative / Interim history: 28 y.o. female with medical history significant of no significant past medical history, no medications, comes into the hospital with abdominal pain, intractable nausea, vomiting, diarrhea.  She failed conservative management in the emergency room and was admitted to the hospital  Hospital Course / Discharge diagnoses: Principal problem Intractable nausea, vomiting, abdominal pain, diarrhea-patient was admitted to the hospital with intractable nausea, vomiting abdominal pain.  She has been having these episodes few times this year.  She was treated conservatively with n.p.o., IV fluids, antiemetics and pain control with improvement in her symptoms.  She was advanced to a regular diet, now tolerating, feeling much better and she will be discharged home in stable condition. CT scan of the abdomen and pelvis showed nonspecific mild periportal edema, however her LFTs are normal so less likely to be clinically significant.  C. difficile and GI pathogen panel were ordered but could not be obtained as her diarrhea resolved   Active problems Fever, rhinorrhea-started while here,  possibly nonspecific viral illness.  COVID, flu, RVP all negative.  Symptomatic management Polysubstance use -tobacco, cocaine, marijuana.  Counseled for cessation Hypokalemia-potassium replaced Abnormal TSH-TSH appears to be low, free T4 normal, raising concern for central issues, and with her nausea and vomiting a cortisol was obtained, normal, so she ruled out adrenal insufficiency.  Recommend repeat thyroid function test as an outpatient in 4 to 5 weeks.  Sepsis ruled out   Discharge Instructions   Allergies as of 11/15/2023   No Known Allergies      Medication List     TAKE these medications    prochlorperazine  5 MG tablet Commonly known as: COMPAZINE  Take 1 tablet (5 mg total) by mouth every 6 (six) hours as needed for nausea or vomiting.        Consultations: none  Procedures/Studies:  CT ABDOMEN PELVIS W CONTRAST Result Date: 11/13/2023 CLINICAL DATA:  Abdominal pain EXAM: CT ABDOMEN AND PELVIS WITH CONTRAST TECHNIQUE: Multidetector CT imaging of the abdomen and pelvis was performed using the standard protocol following bolus administration of intravenous contrast. RADIATION DOSE REDUCTION: This exam was performed according to the departmental dose-optimization program which includes automated exposure control, adjustment of the mA and/or kV according to patient size and/or use of iterative reconstruction technique. CONTRAST:  75mL OMNIPAQUE  IOHEXOL  350 MG/ML SOLN COMPARISON:  10/18/2012 FINDINGS: Lower chest: No acute abnormality Hepatobiliary: No focal hepatic abnormality. Mild periportal edema. Gallbladder unremarkable. Pancreas: No focal abnormality or ductal dilatation. Spleen: No focal abnormality.  Normal size. Adrenals/Urinary Tract: No adrenal abnormality. No focal renal abnormality. No stones or hydronephrosis. Urinary bladder is unremarkable. Stomach/Bowel:  Stomach, large and small bowel grossly unremarkable. Normal appendix. Vascular/Lymphatic: No evidence of  aneurysm or adenopathy. Reproductive: Uterus and adnexa unremarkable.  No mass. Other: No free fluid or free air. Musculoskeletal: No acute bony abnormality. IMPRESSION: Mild periportal edema noted within the liver, nonspecific finding. This could be related to volume overload or inflammatory processes such as hepatitis. Recommend correlation with LFTs. Electronically Signed   By: Franky Crease M.D.   On: 11/13/2023 17:31     Subjective: - no chest pain, shortness of breath, no abdominal pain, nausea or vomiting.   Discharge Exam: BP 115/78 (BP Location: Left Arm)   Pulse 72   Temp 99.3 F (37.4 C)   Resp 17   Ht 5' 5 (1.651 m)   Wt 58.5 kg   LMP 10/30/2023 (Approximate)   SpO2 100%   BMI 21.47 kg/m   General: Pt is alert, awake, not in acute distress Cardiovascular: RRR, S1/S2 +, no rubs, no gallops Respiratory: CTA bilaterally, no wheezing, no rhonchi    The results of significant diagnostics from this hospitalization (including imaging, microbiology, ancillary and laboratory) are listed below for reference.     Microbiology: Recent Results (from the past 240 hours)  Resp panel by RT-PCR (RSV, Flu A&B, Covid) Anterior Nasal Swab     Status: None   Collection Time: 11/13/23 11:42 AM   Specimen: Anterior Nasal Swab  Result Value Ref Range Status   SARS Coronavirus 2 by RT PCR NEGATIVE NEGATIVE Final   Influenza A by PCR NEGATIVE NEGATIVE Final   Influenza B by PCR NEGATIVE NEGATIVE Final    Comment: (NOTE) The Xpert Xpress SARS-CoV-2/FLU/RSV plus assay is intended as an aid in the diagnosis of influenza from Nasopharyngeal swab specimens and should not be used as a sole basis for treatment. Nasal washings and aspirates are unacceptable for Xpert Xpress SARS-CoV-2/FLU/RSV testing.  Fact Sheet for Patients: BloggerCourse.com  Fact Sheet for Healthcare Providers: SeriousBroker.it  This test is not yet approved or cleared  by the [place]  FDA and has been authorized for detection and/or diagnosis of SARS-CoV-2 by FDA under an Emergency Use Authorization (EUA). This EUA will remain in effect (meaning this test can be used) for the duration of the COVID-19 declaration under Section 564(b)(1) of the Act, 21 U.S.C. section 360bbb-3(b)(1), unless the authorization is terminated or revoked.     Resp Syncytial Virus by PCR NEGATIVE NEGATIVE Final    Comment: (NOTE) Fact Sheet for Patients: BloggerCourse.com  Fact Sheet for Healthcare Providers: SeriousBroker.it  This test is not yet approved or cleared by the [place]  FDA and has been authorized for detection and/or diagnosis of SARS-CoV-2 by FDA under an Emergency Use Authorization (EUA). This EUA will remain in effect (meaning this test can be used) for the duration of the COVID-19 declaration under Section 564(b)(1) of the Act, 21 U.S.C. section 360bbb-3(b)(1), unless the authorization is terminated or revoked.  Performed at [facility], [address], [place], [place] [address]   Culture, blood (Routine X 2) w Reflex to ID Panel     Status: None (Preliminary result)   Collection Time: 11/14/23  6:05 PM   Specimen: BLOOD LEFT ARM  Result Value Ref Range Status   Specimen Description BLOOD LEFT ARM  Final   Special Requests   Final    BOTTLES DRAWN AEROBIC AND ANAEROBIC Blood Culture results may not be optimal due to an inadequate volume of blood received in culture bottles   Culture  Final    NO GROWTH < 24 HOURS Performed at [facility], [address] [place], [place] [address]    Report Status PENDING  Incomplete  Culture, blood (Routine X 2) w Reflex to ID Panel     Status: None (Preliminary result)   Collection Time: 11/14/23  6:07 PM   Specimen: BLOOD LEFT ARM  Result Value Ref Range Status   Specimen Description BLOOD LEFT ARM  Final   Special Requests   Final     BOTTLES DRAWN AEROBIC AND ANAEROBIC Blood Culture results may not be optimal due to an inadequate volume of blood received in culture bottles   Culture   Final    NO GROWTH < 24 HOURS Performed at [facility], [address] [place], [place] [address]    Report Status PENDING  Incomplete  SARS Coronavirus 2 by RT PCR (hospital order, performed in [facility] hospital lab) *cepheid single result test* Anterior Nasal Swab     Status: None   Collection Time: 11/15/23  9:57 AM   Specimen: Anterior Nasal Swab  Result Value Ref Range Status   SARS Coronavirus 2 by RT PCR NEGATIVE NEGATIVE Final    Comment: Performed at [facility], [address], [place], [place] [address]  Respiratory (~20 pathogens) panel by PCR     Status: None   Collection Time: 11/15/23  9:57 AM   Specimen: Nasopharyngeal Swab; Respiratory  Result Value Ref Range Status   Adenovirus NOT DETECTED NOT DETECTED Final   Coronavirus 229E NOT DETECTED NOT DETECTED Final    Comment: (NOTE) The Coronavirus on the Respiratory Panel, DOES NOT test for the novel  Coronavirus (2019 nCoV)    Coronavirus HKU1 NOT DETECTED NOT DETECTED Final   Coronavirus NL63 NOT DETECTED NOT DETECTED Final   Coronavirus OC43 NOT DETECTED NOT DETECTED Final   Metapneumovirus NOT DETECTED NOT DETECTED Final   Rhinovirus / Enterovirus NOT DETECTED NOT DETECTED Final   Influenza A NOT DETECTED NOT DETECTED Final   Influenza B NOT DETECTED NOT DETECTED Final   Parainfluenza Virus 1 NOT DETECTED NOT DETECTED Final   Parainfluenza Virus 2 NOT DETECTED NOT DETECTED Final   Parainfluenza Virus 3 NOT DETECTED NOT DETECTED Final   Parainfluenza Virus 4 NOT DETECTED NOT DETECTED Final   Respiratory Syncytial Virus NOT DETECTED NOT DETECTED Final   Bordetella pertussis NOT DETECTED NOT DETECTED Final   Bordetella Parapertussis NOT DETECTED NOT DETECTED Final   Chlamydophila pneumoniae NOT DETECTED NOT DETECTED Final   Mycoplasma pneumoniae  NOT DETECTED NOT DETECTED Final    Comment: Performed at [facility], [address] [place], [place] [address]     Labs: Basic Metabolic Panel: Recent Labs  Lab 11/13/23 1142 11/14/23 0552 11/15/23 0726  NA 144 142 139  K 3.1* 3.3* 3.8  CL 110 112* 106  CO2 22 20* 22  GLUCOSE 101* 122* 128*  BUN 10 10 <5*  CREATININE 0.92 0.99 0.78  CALCIUM  9.3 8.8* 8.9  MG 1.7  --  1.7   Liver Function Tests: Recent Labs  Lab 11/13/23 1142 11/14/23 0552 11/15/23 0726  AST 19 18 16   ALT 15 13 14   ALKPHOS 36* 32* 30*  BILITOT 0.7 0.6 0.6  PROT 7.2 6.5 6.7  ALBUMIN 3.9 3.6 3.4*   CBC: Recent Labs  Lab 11/13/23 1142 11/14/23 0552 11/15/23 0726  WBC 8.7 10.8* 8.2  NEUTROABS 6.3  --   --  HGB 12.2 11.0* 11.3*  HCT 38.5 34.1* 34.0*  MCV 86.5 83.8 84.2  PLT 250 227 205   CBG: No results for input(s): GLUCAP in the last 168 hours. Hgb A1c Recent Labs    11/14/23 0552  HGBA1C 5.8*   Lipid Profile No results for input(s): CHOL, HDL, LDLCALC, TRIG, CHOLHDL, LDLDIRECT in the last 72 hours. Thyroid function studies Recent Labs    11/14/23 0552  TSH 0.025*   Urinalysis    Component Value Date/Time   COLORURINE YELLOW 11/14/2023 0626   APPEARANCEUR HAZY (A) 11/14/2023 0626   LABSPEC 1.008 11/14/2023 0626   PHURINE 6.0 11/14/2023 0626   GLUCOSEU NEGATIVE 11/14/2023 0626   HGBUR MODERATE (A) 11/14/2023 0626   HGBUR trace-intact 12/19/2008 1107   BILIRUBINUR NEGATIVE 11/14/2023 0626   BILIRUBINUR neg 03/16/2020 1144   KETONESUR NEGATIVE 11/14/2023 0626   PROTEINUR NEGATIVE 11/14/2023 0626   UROBILINOGEN 0.2 03/16/2020 1144   UROBILINOGEN 1.0 10/19/2014 1301   NITRITE NEGATIVE 11/14/2023 0626   LEUKOCYTESUR LARGE (A) 11/14/2023 0626    FURTHER DISCHARGE INSTRUCTIONS:   Get Medicines reviewed and adjusted: Please take all your medications with you for your next visit with your Primary MD   Laboratory/radiological data: Please request your  Primary MD to go over all hospital tests and procedure/radiological results at the follow up, please ask your Primary MD to get all Hospital records sent to his/her office.   In some cases, they will be blood work, cultures and biopsy results pending at the time of your discharge. Please request that your primary care M.D. goes through all the records of your hospital data and follows up on these results.   Also Note the following: If you experience worsening of your admission symptoms, develop shortness of breath, life threatening emergency, suicidal or homicidal thoughts you must seek medical attention immediately by calling 911 or calling your MD immediately  if symptoms less severe.   You must read complete instructions/literature along with all the possible adverse reactions/side effects for all the Medicines you take and that have been prescribed to you. Take any new Medicines after you have completely understood and accpet all the possible adverse reactions/side effects.    Do not drive when taking Pain medications or sleeping medications (Benzodaizepines)   Do not take more than prescribed Pain, Sleep and Anxiety Medications. It is not advisable to combine anxiety,sleep and pain medications without talking with your primary care practitioner   Special Instructions: If you have smoked or chewed Tobacco  in the last 2 yrs please stop smoking, stop any regular Alcohol  and or any Recreational drug use.   Wear Seat belts while driving.   Please note: You were cared for by a hospitalist during your hospital stay. Once you are discharged, your primary care physician will handle any further medical issues. Please note that NO REFILLS for any discharge medications will be authorized once you are discharged, as it is imperative that you return to your primary care physician (or establish a relationship with a primary care physician if you do not have one) for your post hospital discharge needs so  that they can reassess your need for medications and monitor your lab values.  Time coordinating discharge: 40 minutes  SIGNED:  Nilda Fendt, MD, PhD 11/15/2023, 6:22 PM

## 2023-11-15 NOTE — Plan of Care (Signed)
  Problem: Education: Goal: Knowledge of General Education information will improve Description: Including pain rating scale, medication(s)/side effects and non-pharmacologic comfort measures Outcome: Progressing   Problem: Education: Goal: Knowledge of General Education information will improve Description: Including pain rating scale, medication(s)/side effects and non-pharmacologic comfort measures Outcome: Progressing   Problem: Health Behavior/Discharge Planning: Goal: Ability to manage health-related needs will improve Outcome: Progressing   Problem: Clinical Measurements: Goal: Will remain free from infection Outcome: Progressing   Problem: Clinical Measurements: Goal: Respiratory complications will improve Outcome: Progressing   Problem: Clinical Measurements: Goal: Cardiovascular complication will be avoided Outcome: Progressing   Problem: Activity: Goal: Risk for activity intolerance will decrease Outcome: Progressing   Problem: Nutrition: Goal: Adequate nutrition will be maintained Outcome: Progressing   Problem: Coping: Goal: Level of anxiety will decrease Outcome: Progressing   Problem: Elimination: Goal: Will not experience complications related to bowel motility Outcome: Progressing   Problem: Pain Managment: Goal: General experience of comfort will improve and/or be controlled Outcome: Progressing   Problem: Safety: Goal: Ability to remain free from injury will improve Outcome: Progressing   Problem: Skin Integrity: Goal: Risk for impaired skin integrity will decrease Outcome: Progressing

## 2023-11-15 NOTE — Plan of Care (Signed)

## 2023-11-15 NOTE — Progress Notes (Deleted)
 PROGRESS NOTE  Mariah Leonard FMW:[id number] DOB: 07/10/1995 DOA: 11/13/2023 PCP: Patient, No Pcp Per   LOS: 1 day   Brief Narrative / Interim history: 28 y.o. female with medical history significant of no significant past medical history, no medications, comes into the hospital with abdominal pain, intractable nausea, vomiting, diarrhea.  She failed conservative management in the emergency room and was admitted to the hospital  Subjective / 24h Interval events: Diarrhea has now resolved.  Still has some nausea and vomiting but she feels like because she has not had anything to eat now for several days  Assesement and Plan: Principal problem Intractable nausea, vomiting, abdominal pain -patient has had this episode in the past, possibly marijuana induced.  Overall improving, feels like the nausea now is due to lack of food.  Will advance diet - CT scan of the abdomen and pelvis showed nonspecific mild periportal edema, however her LFTs are normal so less likely to be clinically significant - Continue supportive care, since she is starting to eat hold further IV fluids   Active problems Fever -also with a runny nose last night.  Culture sent, fever resolved this morning.  Obtain RVP/COVID to rule out concurrent viral illness  Polysubstance use -tobacco, cocaine, marijuana.  Counseled for cessation   Hypokalemia-need to replace potassium  Abnormal TSH-TSH appears to be low, free T4 normal, raising concern for central issues, and with her nausea and vomiting a cortisol was obtained this morning, normal, ruled out adrenal insufficiency  Scheduled Meds:  enoxaparin  (LOVENOX ) injection  40 mg Subcutaneous Q24H   Continuous Infusions: PRN Meds:.acetaminophen  **OR** acetaminophen , HYDROmorphone  (DILAUDID ) injection, ketorolac , ondansetron  **OR** ondansetron  (ZOFRAN ) IV, prochlorperazine   No current outpatient medications  Diet Orders (From admission, onward)     Start     Ordered    11/15/23 0918  Diet regular Room service appropriate? Yes; Fluid consistency: Thin  Diet effective now       Question Answer Comment  Room service appropriate? Yes   Fluid consistency: Thin      11/15/23 0917            DVT prophylaxis: enoxaparin  (LOVENOX ) injection 40 mg Start: 11/13/23 1945   Lab Results  Component Value Date   PLT 205 11/15/2023      Code Status: Full Code  Family Communication: no family at bedside   Status is: Inpatient   Level of care: Med-Surg  Consultants:  none  Objective: Vitals:   11/14/23 1716 11/14/23 2026 11/15/23 0353 11/15/23 0833  BP: (!) 139/94 136/81 (!) 143/97 122/78  Pulse: 67 (!) 59 68 (!) 57  Resp: 18 16 16 16   Temp: 100.2 F (37.9 C) (!) 100.6 F (38.1 C) (!) 100.8 F (38.2 C) 98.9 F (37.2 C)  TempSrc: Oral     SpO2:  100% 100% 100%  Weight:      Height:        Intake/Output Summary (Last 24 hours) at 11/15/2023 0956 Last data filed at 11/15/2023 0445 Gross per 24 hour  Intake 1518.25 ml  Output --  Net 1518.25 ml   Wt Readings from Last 3 Encounters:  11/13/23 58.5 kg  06/01/23 59 kg  05/29/23 59 kg    Examination:  Constitutional: NAD Eyes: lids and conjunctivae normal, no scleral icterus ENMT: mmm Neck: normal, supple Respiratory: clear to auscultation bilaterally, no wheezing, no crackles.  Cardiovascular: Regular rate and rhythm, no murmurs / rubs / gallops.  Abdomen: deferred per patients request. Skin: no rashes Neurologic:  no focal deficits, equal strength  Data Reviewed: I have independently reviewed following labs and imaging studies   CBC Recent Labs  Lab 11/13/23 1142 11/14/23 0552 11/15/23 0726  WBC 8.7 10.8* 8.2  HGB 12.2 11.0* 11.3*  HCT 38.5 34.1* 34.0*  PLT 250 227 205  MCV 86.5 83.8 84.2  MCH 27.4 27.0 28.0  MCHC 31.7 32.3 33.2  RDW 13.1 13.1 12.9  LYMPHSABS 1.7  --   --   MONOABS 0.6  --   --   EOSABS 0.0  --   --   BASOSABS 0.0  --   --     Recent Labs  Lab  11/13/23 1142 11/14/23 0552 11/15/23 0726  NA 144 142 139  K 3.1* 3.3* 3.8  CL 110 112* 106  CO2 22 20* 22  GLUCOSE 101* 122* 128*  BUN 10 10 <5*  CREATININE 0.92 0.99 0.78  CALCIUM  9.3 8.8* 8.9  AST 19 18 16   ALT 15 13 14   ALKPHOS 36* 32* 30*  BILITOT 0.7 0.6 0.6  ALBUMIN 3.9 3.6 3.4*  MG 1.7  --  1.7  TSH  --  0.025*  --   HGBA1C  --  5.8*  --     ------------------------------------------------------------------------------------------------------------------ No results for input(s): CHOL, HDL, LDLCALC, TRIG, CHOLHDL, LDLDIRECT in the last 72 hours.  Lab Results  Component Value Date   HGBA1C 5.8 (H) 11/14/2023   ------------------------------------------------------------------------------------------------------------------ Recent Labs    11/14/23 0552  TSH 0.025*    Cardiac Enzymes No results for input(s): CKMB, TROPONINI, MYOGLOBIN in the last 168 hours.  Invalid input(s): CK ------------------------------------------------------------------------------------------------------------------ No results found for: BNP  CBG: No results for input(s): GLUCAP in the last 168 hours.  Recent Results (from the past 240 hours)  Resp panel by RT-PCR (RSV, Flu A&B, Covid) Anterior Nasal Swab     Status: None   Collection Time: 11/13/23 11:42 AM   Specimen: Anterior Nasal Swab  Result Value Ref Range Status   SARS Coronavirus 2 by RT PCR NEGATIVE NEGATIVE Final   Influenza A by PCR NEGATIVE NEGATIVE Final   Influenza B by PCR NEGATIVE NEGATIVE Final    Comment: (NOTE) The Xpert Xpress SARS-CoV-2/FLU/RSV plus assay is intended as an aid in the diagnosis of influenza from Nasopharyngeal swab specimens and should not be used as a sole basis for treatment. Nasal washings and aspirates are unacceptable for Xpert Xpress SARS-CoV-2/FLU/RSV testing.  Fact Sheet for Patients: BloggerCourse.com  Fact Sheet for Healthcare  Providers: SeriousBroker.it  This test is not yet approved or cleared by the [place]  FDA and has been authorized for detection and/or diagnosis of SARS-CoV-2 by FDA under an Emergency Use Authorization (EUA). This EUA will remain in effect (meaning this test can be used) for the duration of the COVID-19 declaration under Section 564(b)(1) of the Act, 21 U.S.C. section 360bbb-3(b)(1), unless the authorization is terminated or revoked.     Resp Syncytial Virus by PCR NEGATIVE NEGATIVE Final    Comment: (NOTE) Fact Sheet for Patients: BloggerCourse.com  Fact Sheet for Healthcare Providers: SeriousBroker.it  This test is not yet approved or cleared by the [place]  FDA and has been authorized for detection and/or diagnosis of SARS-CoV-2 by FDA under an Emergency Use Authorization (EUA). This EUA will remain in effect (meaning this test can be used) for the duration of the COVID-19 declaration under Section 564(b)(1) of the Act, 21 U.S.C. section 360bbb-3(b)(1), unless the authorization is terminated or revoked.  Performed at [facility]  Lab, [address], [place], [place] [address]   Culture, blood (Routine X 2) w Reflex to ID Panel     Status: None (Preliminary result)   Collection Time: 11/14/23  6:05 PM   Specimen: BLOOD LEFT ARM  Result Value Ref Range Status   Specimen Description BLOOD LEFT ARM  Final   Special Requests   Final    BOTTLES DRAWN AEROBIC AND ANAEROBIC Blood Culture results may not be optimal due to an inadequate volume of blood received in culture bottles   Culture   Final    NO GROWTH < 24 HOURS Performed at [facility], [address], [place], [place] [address]    Report Status PENDING  Incomplete  Culture, blood (Routine X 2) w Reflex to ID Panel     Status: None (Preliminary result)   Collection Time: 11/14/23  6:07 PM   Specimen: BLOOD LEFT ARM  Result  Value Ref Range Status   Specimen Description BLOOD LEFT ARM  Final   Special Requests   Final    BOTTLES DRAWN AEROBIC AND ANAEROBIC Blood Culture results may not be optimal due to an inadequate volume of blood received in culture bottles   Culture   Final    NO GROWTH < 24 HOURS Performed at [facility], [address], [place], [place] [address]    Report Status PENDING  Incomplete   Radiology Studies: No results found.  Nilda Fendt, MD, PhD [facility]  Between 7 am - 7 pm I am available, please contact me via Amion (for emergencies) or Securechat (non urgent messages)  Between 7 pm - 7 am I am not available, please contact night coverage MD/APP via Amion

## 2023-11-15 NOTE — Progress Notes (Signed)
 Specimen sent for C.diff, SARS and UA

## 2023-11-16 LAB — URINE CULTURE: Culture: 20000 — AB

## 2023-11-16 LAB — ACTH: C206 ACTH: 47.2 pg/mL (ref 7.2–63.3)

## 2023-11-16 LAB — T3, FREE: T3, Free: 1.7 pg/mL — ABNORMAL LOW (ref 2.0–4.4)

## 2023-11-19 LAB — CULTURE, BLOOD (ROUTINE X 2)
Culture: NO GROWTH
Culture: NO GROWTH
